# Patient Record
Sex: Female | Born: 1990 | Race: White | Hispanic: Yes | Marital: Single | Smoking: Never smoker
Health system: Southern US, Community
[De-identification: ages and names within clinical notes are randomized; demographics above are authoritative.]

## PROBLEM LIST (undated history)

## (undated) DIAGNOSIS — G35 Multiple sclerosis: Secondary | ICD-10-CM

## (undated) DIAGNOSIS — G43909 Migraine, unspecified, not intractable, without status migrainosus: Secondary | ICD-10-CM

## (undated) HISTORY — PX: LEEP: SHX91

## (undated) HISTORY — PX: TUBAL LIGATION: SHX77

---

## 2018-12-20 DIAGNOSIS — R87612 Low grade squamous intraepithelial lesion on cytologic smear of cervix (LGSIL): Secondary | ICD-10-CM | POA: Insufficient documentation

## 2020-12-10 ENCOUNTER — Encounter: Payer: Self-pay | Admitting: Neurology

## 2020-12-10 ENCOUNTER — Ambulatory Visit: Payer: Medicaid Other | Admitting: Neurology

## 2020-12-10 VITALS — BP 112/74 | HR 75 | Ht 65.0 in | Wt 149.0 lb

## 2020-12-10 DIAGNOSIS — R200 Anesthesia of skin: Secondary | ICD-10-CM | POA: Diagnosis not present

## 2020-12-10 DIAGNOSIS — R9082 White matter disease, unspecified: Secondary | ICD-10-CM | POA: Diagnosis not present

## 2020-12-10 DIAGNOSIS — M791 Myalgia, unspecified site: Secondary | ICD-10-CM | POA: Diagnosis not present

## 2020-12-10 NOTE — Progress Notes (Signed)
[facility]  PATIENT: Jessica Wiggins DOB: May 17, 1991  REFERRING DOCTOR OR PCP: [name] MD SOURCE: Patient, notes from [facility],  _________________________________   HISTORICAL  CHIEF COMPLAINT:  Chief Complaint  Patient presents with  . New Patient (Initial Visit)    RM 54 w/ interpreter, Jessica Wiggins. Paper referral from [facility], MD for new onset MS. Was seen at ED 11/16/20. Presented w/ numbness from neck, down that started 11/13/20. This was the first time she has had these sx. She received IV solumedrol x3 days. No family hx of MS. Still has hand cramps. Leg cramping resolved. When she stands for extended periods, gets tingling in legs. Did not have MRI CD but Dr. Epimenio Foot able to pull up images.    HISTORY OF PRESENT ILLNESS:  I had the pleasure of seeing patient, Jessica Wiggins, at the MS center [facility] for neurologic consultation regarding the possibility of MS.  She is a 30 year old woman who presented to the [facility] ED with numbness below her neck and cramping in her hands bilaterally.   This started about 3 weeks ago.   Initially, she also had some leg cramping.    She denied numbness,    She had an MRi of the brain showing some white matter foci, none that enhanced.   She had a telehealth consult with neurology.  Because the MRI showed some spots, she was admitted and received steroids.  She was admitted to the hospital and had 3 days of IV Solu-medrol.    She feels better since the steroids and now only ha cramping in her hands,      She has no fixed numbness.  However, when she stands up for a long time, she gets tingling in her legs.   She denies weakness.   She has no difficulty with her gait.   Bladder function is normal.   Vision is fine.     She denies any similar symptoms or episodes of numbness, weakness, clumsiness, gait change or vision change n the past.   She has no FH of  MS.     She is otherwise healthy and is not on any medications.     No h/o DM or HTN.   She does not smoke or drink ETOH.        Data: Imaging reviewed: CT scan of the head 11/17/2018 was normal.  MRI of the brain 11/16/2020 showed some scattered T2/FLAIR hyperintense foci in the hemispheres.  Mostly nonspecific.  A couple foci are periventricular.  None are juxtacortical.  No foci in the infratentorial white matter.  Normal enhancement pattern.  Labs: Blood work 11/16/2020 showed normal B12, TSH, unremarkable CBC and CMP.  Negative pregnancy test.  REVIEW OF SYSTEMS: Constitutional: No fevers, chills, sweats, or change in appetite Eyes: No visual changes, double vision, eye pain Ear, nose and throat: No hearing loss, ear pain, nasal congestion, sore throat Cardiovascular: No chest pain, palpitations Respiratory: No shortness of breath at rest or with exertion.   No wheezes GastrointestinaI: No nausea, vomiting, diarrhea, abdominal pain, fecal incontinence Genitourinary: No dysuria, urinary retention or frequency.  No nocturia. Musculoskeletal: No neck pain, back pain Integumentary: No rash, pruritus, skin lesions Neurological: as above Psychiatric: No depression at this time.  No anxiety Endocrine: No palpitations, diaphoresis, change in appetite, change in weigh or increased thirst Hematologic/Lymphatic: No anemia, purpura, petechiae. Allergic/Immunologic: No itchy/runny eyes, nasal congestion, recent allergic reactions, rashes  ALLERGIES: No  Known Allergies  HOME MEDICATIONS: No current outpatient medications on file.  PAST MEDICAL HISTORY: No past medical history on file.  PAST SURGICAL HISTORY: Past Surgical History:  Procedure Laterality Date  . LEEP    . TUBAL LIGATION      FAMILY HISTORY: History reviewed. No pertinent family history.  SOCIAL HISTORY:  Social History   Socioeconomic History  . Marital status: Single    Spouse name: Not on file  . Number  of children: 2  . Years of education: 34  . Highest education level: Not on file  Occupational History  . 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 Smoking status: Never Smoker  . Smokeless tobacco: Never Used  Substance and Sexual Activity  . Alcohol use: Never  . Drug use: Never  . Sexual activity: Not on file  Other Topics Concern  . Not on file  Social History Narrative   Right handed   Lives w/ husband and children   Caffeine use: none   Social Determinants of Health   Financial Resource Strain: Not on file  Food Insecurity: Not on file  Transportation Needs: Not on file  Physical Activity: Not on file  Stress: Not on file  Social Connections: Not on file  Intimate Partner Violence: Not on file     PHYSICAL EXAM  Vitals:   12/10/20 1319  BP: 112/74  Pulse: 75  Weight: 149 lb (67.6 kg)  Height: 5\' 5"  (1.651 m)    Body mass index is 24.79 kg/m.   General: The patient is well-developed and well-nourished and in no acute distress  HEENT:  Head is [place]/AT.  Sclera are anicteric.  Funduscopic exam shows normal optic discs and retinal vessels.  Neck: No carotid bruits are noted.  The neck is nontender.  Cardiovascular: The heart has a regular rate and rhythm with a normal S1 and S2. There were no murmurs, gallops or rubs.    Skin: Extremities are without rash or  edema.  Musculoskeletal:  Back is nontender  Neurologic Exam  Mental status: The patient is alert and oriented x 3 at the time of the examination. The patient has apparent normal recent and remote memory, with an apparently normal attention span and concentration ability.   Speech is normal.  Cranial nerves: Extraocular movements are full. Pupils are equal, round, and reactive to light and accomodation.  Visual fields are full.  Facial symmetry is present. There is good facial sensation to soft touch bilaterally.Facial strength is normal.  Trapezius and sternocleidomastoid strength is normal. No  dysarthria is noted.  The tongue is midline, and the patient has symmetric elevation of the soft palate. No obvious hearing deficits are noted.  Motor:  Muscle bulk is normal.   Tone is normal. Strength is  5 / 5 in all 4 extremities.   Sensory: Sensory testing is intact to pinprick, soft touch and vibration sensation in all 4 extremities.  Coordination: Cerebellar testing reveals good finger-nose-finger and heel-to-shin bilaterally.  Gait and station: Station is normal.   Gait is normal. Tandem gait is normal. Romberg is negative.   Reflexes: Deep tendon reflexes are symmetric and normal bilaterally.   Plantar responses are flexor.      ASSESSMENT AND PLAN  Numbness - Plan: MR CERVICAL SPINE W WO CONTRAST, Pan-ANCA, ANA w/Reflex, Sedimentation rate  Myalgia - Plan: MR CERVICAL SPINE W WO CONTRAST, Pan-ANCA, ANA w/Reflex, Sedimentation rate  White matter abnormality on MRI of brain - Plan: MR CERVICAL SPINE W WO  CONTRAST, Pan-ANCA, ANA w/Reflex, Sedimentation rate   In summary, Jessica Wiggins is a 30 year old woman who presented to the emergency room with nonfocal paresthesias and cramps and was found on MRI to have abnormal T2/flair hyperintense foci.  The pattern is quite nonspecific with only a couple small foci in the periventricular white matter and most foci in the deep white matter.  However, given her age MS is a possibility.  Her symptoms presented with bilateral numbness.  MRI of the cervical spine was not performed while she was in the ED or hospital though that would be the most likely location that could explain her symptoms.  We will check an MRI of the cervical spine.  If 1 or more foci are noted, then the possibility that she has MS is much greater and we would need to consider a disease modifying therapy.  However, if no foci are noted, MS becomes less likely.  We then would have a couple of options including checking CSF or doing a follow-up MRI in 6 to 9 months.  As the  foci on the MRI are quite nonspecifc, I think repeating an MRI in 6 to 9 months would be her best option.  I will have her come back to see me in 6 months if the MRI of the cervical spine is normal (and then we would set up a follow-up MRI of the brain) but will have her come in within a few weeks if the MRI of the cervical spine shows any foci concerning for MS.  Additionally I will check some blood work for vasculitis.  Thank you for asking me to see Ms. Jessica Wiggins.  Please let me know if I can be of further assistance with her or other patients in the future.   Lucila Klecka A. Epimenio Foot, MD, [facility] 12/10/2020, 1:54 PM Certified in Neurology, Clinical Neurophysiology, Sleep Medicine and Neuroimaging  [facility] [address] [place], [place] [address] [phone]

## 2020-12-12 LAB — PAN-ANCA
ANCA Proteinase 3: <3.5 U/mL (ref 0.0–3.5)
Atypical pANCA: <1:20 {titer}
C-ANCA: <1:20 {titer}
Myeloperoxidase Ab: <9 U/mL (ref 0.0–9.0)
P-ANCA: <1:20 {titer}

## 2020-12-12 LAB — ANA W/REFLEX: Anti Nuclear Antibody (ANA): NEGATIVE

## 2020-12-12 LAB — SEDIMENTATION RATE: Sed Rate: 2 mm/hr (ref 0–32)

## 2020-12-14 ENCOUNTER — Telehealth: Payer: Self-pay | Admitting: *Deleted

## 2020-12-14 ENCOUNTER — Telehealth: Payer: Self-pay | Admitting: Neurology

## 2020-12-14 NOTE — Telephone Encounter (Signed)
[facility] community plan Berkley Harvey #[id number] exp. 12/14/20 to 01/28/21 sent to GI for scheduling [facility] 12/14/20

## 2020-12-14 NOTE — Telephone Encounter (Signed)
-----   Message from Asa Lente, MD sent at 12/12/2020 11:52 AM EDT ----- Please let the patient know that the lab work is fine.

## 2020-12-14 NOTE — Telephone Encounter (Signed)
Called and spoke with pt about lab results per MD note. Pt verbalized understanding, had no further questions.

## 2020-12-17 ENCOUNTER — Other Ambulatory Visit: Payer: Self-pay

## 2020-12-17 ENCOUNTER — Ambulatory Visit
Admission: RE | Admit: 2020-12-17 | Discharge: 2020-12-17 | Disposition: A | Discharge status: Home / Self Care | Payer: Medicaid Other | Source: Ambulatory Visit | Attending: Neurology | Admitting: Neurology

## 2020-12-17 DIAGNOSIS — R9082 White matter disease, unspecified: Secondary | ICD-10-CM | POA: Diagnosis not present

## 2020-12-17 DIAGNOSIS — M791 Myalgia, unspecified site: Secondary | ICD-10-CM

## 2020-12-17 DIAGNOSIS — R200 Anesthesia of skin: Secondary | ICD-10-CM

## 2020-12-17 MED ORDER — GADOBENATE DIMEGLUMINE 529 MG/ML IV SOLN
13.0000 mL | Freq: Once | INTRAVENOUS | Status: AC | PRN
  Administered 2020-12-17: 13 mL via INTRAVENOUS

## 2020-12-23 ENCOUNTER — Telehealth: Payer: Self-pay | Admitting: *Deleted

## 2020-12-23 DIAGNOSIS — G35 Multiple sclerosis: Secondary | ICD-10-CM

## 2020-12-23 NOTE — Telephone Encounter (Signed)
Called interpreter services at [phone] and spoke w/ [place], [place] [id number]. She called and we spoke w/ pt. Relayed results per Dr. Bonnita Hollow note. Pt verbalized understanding. Scheduled follow up for 01/18/21 at 3:30pm w/ Dr. Epimenio Foot. Placed order for LP. She will call back if she does not hear in the next week or so to get scheduled.

## 2020-12-23 NOTE — Telephone Encounter (Signed)
-----   Message from Asa Lente, MD sent at 12/22/2020  2:22 PM EDT ----- MRI of the cervical spinal cord showed one spot so she might have MS --- I would like to have her get a lumbar puncture for typical MS studies (oligoclonal bands, IgG index, prot, gluc, cell count, VDRL) and then see me 2-3 weeks later

## 2020-12-23 NOTE — Telephone Encounter (Signed)
Faxed LP order to [facility] at [facility] @ [phone].

## 2020-12-30 ENCOUNTER — Other Ambulatory Visit: Payer: Self-pay

## 2020-12-30 ENCOUNTER — Ambulatory Visit
Admission: RE | Admit: 2020-12-30 | Discharge: 2020-12-30 | Disposition: A | Discharge status: Home / Self Care | Payer: Medicaid Other | Source: Ambulatory Visit | Attending: Neurology | Admitting: Neurology

## 2020-12-30 DIAGNOSIS — G35 Multiple sclerosis: Secondary | ICD-10-CM

## 2020-12-30 NOTE — Discharge Instructions (Signed)
Lumbar Puncture Discharge Instructions  1. Go home and rest quietly as needed. You may resume normal activities; however, do not exert yourself strongly or do any heavy lifting today and tomorrow.   2. DO NOT drive today.    3. You may resume your normal diet and medications unless otherwise indicated. Drink lots of extra fluids today and tomorrow.   4. The incidence of headache, nausea, or vomiting is about 5% (one in 20 patients).  If you develop a headache, lie flat for 24 hours and drink plenty of fluids until the headache goes away.  Caffeinated beverages may be helpful. If when you get up you still have a headache when standing, go back to bed and force fluids for another 24 hours.   5. If you develop severe nausea and vomiting or a headache that does not go away with the flat bedrest after 48 hours, please call [phone].   6. Call your physician for a follow-up appointment.  The results of your Lumbar Puncture will be sent directly to your physician and they will contact you.   7. If you have any questions or if complications develop after you arrive home, please call [phone].  Discharge instructions have been explained to the patient.  The patient, or the person responsible for the patient, fully understands these instructions.   Thank you for visiting our office today.  

## 2020-12-30 NOTE — Progress Notes (Signed)
Blood drawn from pts LAC to be sent off with LP lab work. 1 successful attempt, pt tolerated well. Gauze and tape applied after.

## 2021-01-03 LAB — VDRL, CSF: VDRL Quant, CSF: NONREACTIVE

## 2021-01-03 LAB — CNS IGG SYNTHESIS RATE, CSF+BLOOD
Albumin Serum: 3.9 g/dL (ref 3.5–5.2)
Albumin, CSF: 21.4 mg/dL (ref 8.0–42.0)
CNS-IgG Synthesis Rate: 7.2 mg/24 h — ABNORMAL HIGH (ref <=3.3)
IgG (Immunoglobin G), Serum: 987 mg/dL (ref 600–1640)
IgG Total CSF: 4.6 mg/dL (ref 0.8–7.7)
IgG-Index: 0.85 — ABNORMAL HIGH (ref <0.66)

## 2021-01-03 LAB — CSF CELL COUNT WITH DIFFERENTIAL
RBC Count, CSF: 0 cells/uL
WBC, CSF: 2 cells/uL (ref 0–5)

## 2021-01-03 LAB — GLUCOSE, CSF: Glucose, CSF: 49 mg/dL (ref 40–80)

## 2021-01-03 LAB — OLIGOCLONAL BANDS, CSF + SERM

## 2021-01-03 LAB — PROTEIN, CSF: Total Protein, CSF: 40 mg/dL (ref 15–45)

## 2021-01-05 ENCOUNTER — Telehealth: Payer: Self-pay | Admitting: Neurology

## 2021-01-05 NOTE — Telephone Encounter (Signed)
Spinal fluid results came back showing an elevated IgG index and the presence of oligoclonal bands.  Therefore, the combination of her symptoms, MRI and CSF allows [place] to diagnose her with clinically definite MS.  She has an appointment to come in 01/18/2021 and we will initiate a disease modifying therapy.  She speaks Spanish and we will get a Spanish interpreter for the visit

## 2021-01-18 ENCOUNTER — Encounter: Payer: Self-pay | Admitting: Neurology

## 2021-01-18 ENCOUNTER — Ambulatory Visit: Payer: Medicaid Other | Admitting: Neurology

## 2021-01-18 ENCOUNTER — Other Ambulatory Visit: Payer: Self-pay

## 2021-01-18 VITALS — BP 104/67 | HR 66 | Ht 65.0 in | Wt 149.5 lb

## 2021-01-18 DIAGNOSIS — Z79899 Other long term (current) drug therapy: Secondary | ICD-10-CM

## 2021-01-18 DIAGNOSIS — G35 Multiple sclerosis: Secondary | ICD-10-CM | POA: Insufficient documentation

## 2021-01-18 DIAGNOSIS — E559 Vitamin D deficiency, unspecified: Secondary | ICD-10-CM

## 2021-01-18 HISTORY — DX: Other long term (current) drug therapy: Z79.899

## 2021-01-18 HISTORY — DX: Vitamin D deficiency, unspecified: E55.9

## 2021-01-18 NOTE — Progress Notes (Signed)
[facility]  PATIENT: Jessica Wiggins DOB: 06-07-1991  REFERRING DOCTOR OR PCP: [name] MD SOURCE: Patient, notes from [facility],  _________________________________   HISTORICAL  CHIEF COMPLAINT:  Chief Complaint  Patient presents with   Follow-up    RM 12. Last seen 12/10/20. Newly dx MS patient. Here w/ spanish interpreter.    HISTORY OF PRESENT ILLNESS:  Jessica Wiggins, is a 30 y.o. woman with MS.  Update 01/18/2021: Since the last visit, she had an MRI of the cervical spine showing a non-enhancing focus at C3 potentially worrisome for MS.   CSF showed oligoclonal bands allowing the diagnosis of clinically definite MS.     We discussed aspects of MS and treatment.  We went over some options.   MS History: She presented to [facility] ED in April 2022 with numbness below her neck and cramping in her hands bilaterally.       Initially, she also had some leg cramping.    She denied numbness,    She had an MRi of the brain showing some white matter foci, none that enhanced.   She had a telehealth consult with neurology.  Because the MRI showed some spots, she was admitted and received steroids.  She was admitted to the hospital and had 3 days of IV Solu-medrol.    She feels better since the steroids and now only ha cramping in her hands,      She has no fixed numbness.  However, when she stands up for a long time, she gets tingling in her legs.   She denies weakness.   She has no difficulty with her gait.   Bladder function is normal.   Vision is fine.     She denies any similar symptoms or episodes of numbness, weakness, clumsiness, gait change or vision change n the past.   She has no FH of MS.     She is otherwise healthy and is not on any medications.     No h/o DM or HTN.   She does not smoke or drink ETOH.        Data: Imaging reviewed: CT scan of the head 11/17/2018 was normal.  MRI of the brain 11/16/2020 showed some  scattered T2/FLAIR hyperintense foci in the hemispheres.  Mostly nonspecific.  A couple foci are periventricular.  None are juxtacortical.  No foci in the infratentorial white matter.  Normal enhancement pattern.  MRI cervical spine 12/17/2020 showed a T2 hyperintense focus within the central and posterior spinal cord adjacent to C3.  It does not enhance but slightly expands the cervical spinal cord.  The focus could be consistent with demyelination from transverse myelitis or multiple sclerosis  Labs: Blood work 11/16/2020 showed normal B12, TSH, unremarkable CBC and CMP.  Negative pregnancy test.  CSF 12/30/2020 showed > 5 oligoclonal bands and IgG index was elevate (0.85)   REVIEW OF SYSTEMS: Constitutional: No fevers, chills, sweats, or change in appetite Eyes: No visual changes, double vision, eye pain Ear, nose and throat: No hearing loss, ear pain, nasal congestion, sore throat Cardiovascular: No chest pain, palpitations Respiratory:  No shortness of breath at rest or with exertion.   No wheezes GastrointestinaI: No nausea, vomiting, diarrhea, abdominal pain, fecal incontinence Genitourinary:  No dysuria, urinary retention or frequency.  No nocturia. Musculoskeletal:  No neck pain, back pain Integumentary: No rash, pruritus, skin lesions Neurological: as above Psychiatric: No depression at this time.  No anxiety Endocrine: No palpitations, diaphoresis,  change in appetite, change in weigh or increased thirst Hematologic/Lymphatic:  No anemia, purpura, petechiae. Allergic/Immunologic: No itchy/runny eyes, nasal congestion, recent allergic reactions, rashes  ALLERGIES: No Known Allergies  HOME MEDICATIONS: No current outpatient medications on file.  PAST MEDICAL HISTORY: No past medical history on file.  PAST SURGICAL HISTORY: Past Surgical History:  Procedure Laterality Date   LEEP     TUBAL LIGATION      FAMILY HISTORY: No family history on file.  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  Tobacco Use   Smoking status: Never   Smokeless tobacco: Never  Substance and Sexual Activity   Alcohol use: Never   Drug use: Never   Sexual activity: Not on file  Other Topics Concern   Not on file  Social History Narrative   Right handed   Lives w/ husband and children   Caffeine use: none   Social Determinants of Health   Financial Resource Strain: Not on file  Food Insecurity: Not on file  Transportation Needs: Not on file  Physical Activity: Not on file  Stress: Not on file  Social Connections: Not on file  Intimate Partner Violence: Not on file     PHYSICAL EXAM  Vitals:   01/18/21 1501  BP: 104/67  Pulse: 66  Weight: 149 lb 8 oz (67.8 kg)  Height: 5\' 5"  (1.651 m)    Body mass index is 24.88 kg/m.   General: The patient is well-developed and well-nourished and in no acute distress  HEENT:  Head is [place]/AT.  Sclera are anicteric.    Neck:  The neck is nontender.  Skin: Extremities are without rash or  edema.  Neurologic Exam  Mental status: The patient is alert and oriented x 3 at the time of the examination. The patient has apparent normal recent and remote memory, with an apparently normal attention span and concentration ability.   Speech is normal.  Cranial nerves: Extraocular movements are full.  Color vision was symmetric.  Facial symmetry is present. There is good facial sensation to soft touch bilaterally.Facial strength is normal.  Trapezius and sternocleidomastoid strength is normal. No dysarthria is noted.  The tongue is midline, and the patient has symmetric elevation of the soft palate. No obvious hearing deficits are noted.  Motor:  Muscle bulk is normal.   Tone is normal. Strength is  5 / 5 in  all 4 extremities.   Sensory: Now has normal sensation in the arms and legs..  Coordination: Cerebellar testing reveals good finger-nose-finger and heel-to-shin bilaterally.  Gait and station: Station is normal.   Gait is normal. Tandem gait is minimally wide.. Romberg is negative.   Reflexes: Deep tendon reflexes are symmetric and normal bilaterally.   Plantar responses are flexor.      ASSESSMENT AND PLAN  Multiple sclerosis (HCC) - Plan: CBC with Differential/Platelet, Stratify JCV Antibody Test ([facility]), Varicella zoster antibody, IgG, Hepatic function panel  High risk medication use - Plan: CBC with Differential/Platelet, Stratify JCV Antibody Test ([facility]), Varicella zoster antibody, IgG, Hepatic function panel  Vitamin D deficiency - Plan: VITAMIN D 25 Hydroxy (Vit-D Deficiency, Fractures)  She has clinically definite multiple sclerosis.  The course is consistent with relapsing remitting MS.  We discussed options and  she will start Tecfidera.  We discussed the possible adverse events.  She will let us know if she has any major problems with tolerability. Stay active and exercise as tolerated. Check vitamin D.  We discussed the relationship between low vitamin D and risk of MS issues. Return in 4 months or sooner if there are new or worsening neurologic symptoms.  Chauncy Mangiaracina A. Epimenio Foot, MD, [facility] 01/18/2021, 6:42 PM Certified in Neurology, Clinical Neurophysiology, Sleep Medicine and Neuroimaging  [facility] [address], [address] [place], [place] [address] [phone]

## 2021-01-19 ENCOUNTER — Telehealth: Payer: Self-pay

## 2021-01-19 LAB — HEPATIC FUNCTION PANEL
ALT: 9 IU/L (ref 0–32)
AST: 14 IU/L (ref 0–40)
Albumin: 4.5 g/dL (ref 3.9–5.0)
Alkaline Phosphatase: 66 IU/L (ref 44–121)
Bilirubin Total: 0.5 mg/dL (ref 0.0–1.2)
Bilirubin, Direct: 0.13 mg/dL (ref 0.00–0.40)
Total Protein: 6.6 g/dL (ref 6.0–8.5)

## 2021-01-19 LAB — CBC WITH DIFFERENTIAL/PLATELET
Basophils Absolute: 0 10*3/uL (ref 0.0–0.2)
Basos: 1 %
EOS (ABSOLUTE): 0.2 10*3/uL (ref 0.0–0.4)
Eos: 3 %
Hematocrit: 42.1 % (ref 34.0–46.6)
Hemoglobin: 14.2 g/dL (ref 11.1–15.9)
Immature Grans (Abs): 0 10*3/uL (ref 0.0–0.1)
Immature Granulocytes: 0 %
Lymphocytes Absolute: 1.5 10*3/uL (ref 0.7–3.1)
Lymphs: 21 %
MCH: 29.8 pg (ref 26.6–33.0)
MCHC: 33.7 g/dL (ref 31.5–35.7)
MCV: 88 fL (ref 79–97)
Monocytes Absolute: 0.6 10*3/uL (ref 0.1–0.9)
Monocytes: 8 %
Neutrophils Absolute: 4.9 10*3/uL (ref 1.4–7.0)
Neutrophils: 67 %
Platelets: 260 10*3/uL (ref 150–450)
RBC: 4.77 x10E6/uL (ref 3.77–5.28)
RDW: 12.5 % (ref 11.7–15.4)
WBC: 7.2 10*3/uL (ref 3.4–10.8)

## 2021-01-19 LAB — VARICELLA ZOSTER ANTIBODY, IGG: Varicella zoster IgG: <135 index — ABNORMAL LOW (ref >165)

## 2021-01-19 LAB — VITAMIN D 25 HYDROXY (VIT D DEFICIENCY, FRACTURES): Vit D, 25-Hydroxy: 21.3 ng/mL — ABNORMAL LOW (ref 30.0–100.0)

## 2021-01-19 MED ORDER — VITAMIN D (ERGOCALCIFEROL) 1.25 MG (50000 UNIT) PO CAPS: 50000.0000 [IU] | ORAL_CAPSULE | ORAL | 1 refills | Status: DC

## 2021-01-19 NOTE — Telephone Encounter (Signed)
I spoke with Dr. Epimenio Foot regarding patient's lab work.  Patient is not immune to varicella.  She is not eligible yet for shingles vaccine.  She could get the chickenpox vaccine but she would need to be off Tecfidera or medications for at least a month.  Dr. Epimenio Foot does not feel this is worth it at this point.  Patient also does not have a primary care to coordinate this immunization.  Patient's vitamin D level was low and Dr. Epimenio Foot recommended supplementing her with vitamin D with 50,000 units once weekly for 6 months.  Otherwise, her labs look fine.  The Tecfidera start form can be sent in.  I called patient using a Spanish interpreter to discuss this.  Patient is agreeable to this plan.  She asked that the vitamin D be called into [facility] in [place].  I advised her that I will start working with her insurance to obtain authorization for Tecfidera and she will be getting calls from [employer].  I will check on her in a week or 2 to see if she has received her Tecfidera.  Patient verbalized understanding.  PA initiated on CMM. Key: [id number].  Sent to [facility] [employer]. Should have a determination in 1-3 business days.

## 2021-01-20 NOTE — Telephone Encounter (Signed)
Received fax from [facility] that no PA needed for Tecfidera, must fill 30 days supply at a time. Faxed notice to [employer] at [phone]. Received fax confirmation.

## 2021-02-15 NOTE — Telephone Encounter (Signed)
I called patient using Spanish interpreter [place], [place] [id number].  Patient reports that she received Tecfidera and is taking it.  She is tolerating it fairly well.  She has mild headaches that she is able to control with OTC meds.  She would like to continue with the Tecfidera.  I reminded her of her appointment with Dr. Epimenio Foot on September 29.  Patient verbalized understanding.  She will let us know if she has further questions or concerns.

## 2021-05-06 ENCOUNTER — Other Ambulatory Visit: Payer: Self-pay

## 2021-05-06 ENCOUNTER — Encounter: Payer: Self-pay | Admitting: Neurology

## 2021-05-06 ENCOUNTER — Ambulatory Visit: Payer: Medicaid Other | Admitting: Neurology

## 2021-05-06 VITALS — BP 114/62 | HR 75 | Ht 65.0 in | Wt 163.0 lb

## 2021-05-06 DIAGNOSIS — R200 Anesthesia of skin: Secondary | ICD-10-CM

## 2021-05-06 DIAGNOSIS — E559 Vitamin D deficiency, unspecified: Secondary | ICD-10-CM

## 2021-05-06 DIAGNOSIS — G35 Multiple sclerosis: Secondary | ICD-10-CM | POA: Diagnosis not present

## 2021-05-06 DIAGNOSIS — M791 Myalgia, unspecified site: Secondary | ICD-10-CM

## 2021-05-06 DIAGNOSIS — Z79899 Other long term (current) drug therapy: Secondary | ICD-10-CM | POA: Diagnosis not present

## 2021-05-06 HISTORY — DX: Anesthesia of skin: R20.0

## 2021-05-06 MED ORDER — GABAPENTIN 300 MG PO CAPS: 300.0000 mg | ORAL_CAPSULE | Freq: Three times a day (TID) | ORAL | 5 refills | Status: DC

## 2021-05-06 NOTE — Progress Notes (Signed)
[facility]  PATIENT: Jessica Wiggins DOB: 1991-05-18  REFERRING DOCTOR OR PCP: [name] MD SOURCE: Patient, notes from [facility] health,  _________________________________   HISTORICAL  CHIEF COMPLAINT:  Chief Complaint  Patient presents with   Follow-up    Rm 1, w mother as interpreter. Here for 3 month MS f/u, pt was started on Tefidera, had HA for the first few days and has been fine since. Pt continues to have bilateral hand cramping.     HISTORY OF PRESENT ILLNESS:  Jessica Wiggins, is a 30 y.o. woman with MS.  Update 05/06/2021: At the last visit, Tecfidera was started,.   She is tolerating it well.    Her MS has been mostly stable.    She has cramp-like pain in her  elbows to her hands but no new numbness or weakness.    This started in April.  She does not report any phasic spasms or spasticity in the arms.   MRI of the cervical spine showing a non-enhancing focus at C3 potentially worrisome for MS.   CSF showed oligoclonal bands allowing the diagnosis of clinically definite MS.     Gait is off balance though she is able to keep up with everybody he can easily walk a mile.  She can go up and down stairs without using the banister but will often use it for safety..  She gets dysesthetic pain in her hands.  The numbness resolved elsewhere.  Vision is fine.  Bladder function is fine.  She denies depression.  Cognitive function is fine.  MS History: She presented to the [facility] ED in April 2022 with numbness below her neck and cramping in her hands bilaterally.       Initially, she also had some leg cramping.    She denied numbness,    She had an MRi of the brain showing some white matter foci, none that enhanced.   She had a telehealth consult with neurology.  Because the MRI showed some spots, she was admitted and received steroids.  She was admitted to the hospital and had 3 days of IV Solu-medrol.    She feels better since the steroids and  now only ha cramping in her hands,      She has no fixed numbness.  However, when she stands up for a long time, she gets tingling in her legs.   She denies weakness.   She has no difficulty with her gait.   Bladder function is normal.   Vision is fine.     She denies any similar symptoms or episodes of numbness, weakness, clumsiness, gait change or vision change n the past.   She has no FH of MS.     She is otherwise healthy and is not on any medications.     No h/o DM or HTN.   She does not smoke or drink ETOH.        Data: Imaging reviewed: CT scan of the head 11/17/2018 was normal.  MRI of the brain 11/16/2020 showed some scattered T2/FLAIR hyperintense foci in the hemispheres.  Mostly nonspecific.  A couple foci are periventricular.  None are juxtacortical.  No foci in the infratentorial white matter.  Normal enhancement pattern.  MRI cervical spine 12/17/2020 showed a T2 hyperintense focus within the central and posterior spinal cord adjacent to C3.  It does not enhance but slightly expands the cervical spinal cord.  The focus could be consistent with demyelination from transverse myelitis or  multiple sclerosis  Labs: Blood work 11/16/2020 showed normal B12, TSH, unremarkable CBC and CMP.  Negative pregnancy test.  CSF 12/30/2020 showed > 5 oligoclonal bands and IgG index was elevate (0.85)   REVIEW OF SYSTEMS: Constitutional: No fevers, chills, sweats, or change in appetite Eyes: No visual changes, double vision, eye pain Ear, nose and throat: No hearing loss, ear pain, nasal congestion, sore throat Cardiovascular: No chest pain, palpitations Respiratory:  No shortness of breath at rest or with exertion.   No wheezes GastrointestinaI: No nausea, vomiting, diarrhea, abdominal pain, fecal incontinence Genitourinary:  No dysuria, urinary retention or frequency.  No nocturia. Musculoskeletal:  No neck pain, back pain Integumentary: No rash, pruritus, skin lesions Neurological: as  above Psychiatric: No depression at this time.  No anxiety Endocrine: No palpitations, diaphoresis, change in appetite, change in weigh or increased thirst Hematologic/Lymphatic:  No anemia, purpura, petechiae. Allergic/Immunologic: No itchy/runny eyes, nasal congestion, recent allergic reactions, rashes  ALLERGIES: No Known Allergies  HOME MEDICATIONS:  Current Outpatient Medications:    Dimethyl Fumarate 240 MG CPDR, Take 240 mg by mouth 2 (two) times daily. Brand Name, Disp: , Rfl:    gabapentin (NEURONTIN) 300 MG capsule, Take 1 capsule (300 mg total) by mouth 3 (three) times daily., Disp: 90 capsule, Rfl: 5   Vitamin D, Ergocalciferol, (DRISDOL) 1.25 MG (50000 UNIT) CAPS capsule, Take 1 capsule (50,000 Units total) by mouth every 7 (seven) days., Disp: 13 capsule, Rfl: 1  PAST MEDICAL HISTORY: History reviewed. No pertinent past medical history.  PAST SURGICAL HISTORY: Past Surgical History:  Procedure Laterality Date   LEEP     TUBAL LIGATION      FAMILY HISTORY: History reviewed. No pertinent family history.  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  Tobacco Use   Smoking status: Never   Smokeless tobacco: Never  Substance and Sexual Activity   Alcohol use: Never   Drug use: Never   Sexual activity: Not on file  Other Topics Concern   Not on file  Social History Narrative   Right handed   Lives w/ husband and children   Caffeine use: none   Social Determinants of Health   Financial Resource Strain: Not on file  Food Insecurity: Not on file  Transportation Needs: Not on file  Physical Activity: Not on file  Stress: Not on file  Social Connections: Not on file  Intimate Partner Violence: Not on file     PHYSICAL EXAM  Vitals:   05/06/21  1435  BP: 114/62  Pulse: 75  Weight: 163 lb (73.9 kg)  Height: 5\' 5"  (1.651 m)    Body mass index is 27.12 kg/m.   General: The patient is well-developed and well-nourished and in no acute distress  HEENT:  Head is [place]/AT.  Sclera are anicteric.    Neck:  The neck is nontender.  Skin: Extremities are without rash or  edema.  Neurologic Exam  Mental status: The patient is alert and oriented x 3 at the time of the examination. The patient has apparent normal recent and remote memory, with an apparently normal attention span and concentration ability.   Speech is normal.  Cranial nerves: Extraocular movements are full.  Color vision was symmetric.  Facial symmetry is  present. There is good facial sensation to soft touch bilaterally.Facial strength is normal.   No obvious hearing deficits are noted.  Motor:  Muscle bulk is normal.   Tone is normal. Strength is  5 / 5 in all 4 extremities.   Sensory: Now has normal sensation in the arms and legs..  Coordination: Cerebellar testing reveals good finger-nose-finger and heel-to-shin bilaterally.  Gait and station: Station is normal.   Gait is normal. Tandem gait is wide..  Her current was mildly unstable Romberg is negative.   Reflexes: Deep tendon reflexes are symmetric and normal bilaterally.   r.      ASSESSMENT AND PLAN  Multiple sclerosis (HCC) - Plan: CBC with Differential/Platelet, Hepatic function panel  High risk medication use - Plan: CBC with Differential/Platelet, Hepatic function panel  Numbness  Myalgia  Vitamin D deficiency  She will continue Tecfidera.  Check CBC with differential and liver function test today.  Sometime next year we will check an MRI of the brain and compared with her previous one to determine if there is any subclinical progression.  If this is occurring we would need to change to a more efficacious disease modifying therapy.   Stay active and exercise as tolerated. Check vitamin D.  We  discussed the relationship between low vitamin D and risk of MS issues. Return in 6 months or sooner if there are new or worsening neurologic symptoms.  Myonna Chisom A. Epimenio Foot, MD, [facility] 05/06/2021, 5:14 PM Certified in Neurology, Clinical Neurophysiology, Sleep Medicine and Neuroimaging  [facility] [address], [address] [place], [place] [address] [phone]

## 2021-05-07 LAB — CBC WITH DIFFERENTIAL/PLATELET
Basophils Absolute: 0 10*3/uL (ref 0.0–0.2)
Basos: 1 %
EOS (ABSOLUTE): 0.3 10*3/uL (ref 0.0–0.4)
Eos: 4 %
Hematocrit: 36.5 % (ref 34.0–46.6)
Hemoglobin: 12.4 g/dL (ref 11.1–15.9)
Immature Grans (Abs): 0 10*3/uL (ref 0.0–0.1)
Immature Granulocytes: 0 %
Lymphocytes Absolute: 1 10*3/uL (ref 0.7–3.1)
Lymphs: 12 %
MCH: 29.6 pg (ref 26.6–33.0)
MCHC: 34 g/dL (ref 31.5–35.7)
MCV: 87 fL (ref 79–97)
Monocytes Absolute: 0.8 10*3/uL (ref 0.1–0.9)
Monocytes: 10 %
Neutrophils Absolute: 5.9 10*3/uL (ref 1.4–7.0)
Neutrophils: 73 %
Platelets: 304 10*3/uL (ref 150–450)
RBC: 4.19 x10E6/uL (ref 3.77–5.28)
RDW: 12 % (ref 11.7–15.4)
WBC: 8.1 10*3/uL (ref 3.4–10.8)

## 2021-05-07 LAB — HEPATIC FUNCTION PANEL
ALT: 14 IU/L (ref 0–32)
AST: 25 IU/L (ref 0–40)
Albumin: 4.6 g/dL (ref 3.9–5.0)
Alkaline Phosphatase: 60 IU/L (ref 44–121)
Bilirubin Total: 0.4 mg/dL (ref 0.0–1.2)
Bilirubin, Direct: 0.14 mg/dL (ref 0.00–0.40)
Total Protein: 6.8 g/dL (ref 6.0–8.5)

## 2021-05-10 ENCOUNTER — Telehealth: Payer: Self-pay | Admitting: *Deleted

## 2021-05-10 NOTE — Telephone Encounter (Signed)
Called interpreter service line and spoke w/ spanish interpreter, Yaakov Guthrie (ID [phone]). He tried calling pt, went to VM, VM not set up. He called husband on DPR and relayed results per Dr. Epimenio Foot note. Husband verbalized understanding.

## 2021-05-10 NOTE — Telephone Encounter (Signed)
-----   Message from Asa Lente, MD sent at 05/07/2021 10:16 AM EDT ----- Please let her or family know that her lab work was normal.

## 2021-06-17 ENCOUNTER — Other Ambulatory Visit: Payer: Self-pay

## 2021-06-17 ENCOUNTER — Encounter: Payer: Self-pay | Admitting: Neurology

## 2021-06-17 ENCOUNTER — Ambulatory Visit: Payer: Medicaid Other | Admitting: Neurology

## 2021-06-17 VITALS — BP 117/68 | HR 69 | Ht 65.0 in | Wt 166.0 lb

## 2021-06-17 DIAGNOSIS — G35 Multiple sclerosis: Secondary | ICD-10-CM

## 2021-06-17 DIAGNOSIS — R200 Anesthesia of skin: Secondary | ICD-10-CM

## 2021-06-17 DIAGNOSIS — Z79899 Other long term (current) drug therapy: Secondary | ICD-10-CM | POA: Diagnosis not present

## 2021-06-17 DIAGNOSIS — E559 Vitamin D deficiency, unspecified: Secondary | ICD-10-CM | POA: Diagnosis not present

## 2021-06-17 NOTE — Progress Notes (Signed)
[facility]  PATIENT: Jessica Wiggins DOB: [birth year]  REFERRING DOCTOR OR PCP: [name] MD SOURCE: Patient, notes from [facility] health,  _________________________________   HISTORICAL  CHIEF COMPLAINT:  Chief Complaint  Patient presents with   Follow-up    Rm 2, w interpreter Marcial Pacas. Here for 6 month MS f/u, on dimethyl Fumerate and tolerating well. Pt reports not taking gabapentin due to GI pn. Pt reports chest tightness and burning Friday. Pt went to the ED on Saturday early morning and was not seen. Pt then returned Monday 06/14/21 had work up done and everything came back normal. Pt continues to have HA. Taking Excedrin.     HISTORY OF PRESENT ILLNESS:  Jessica Wiggins, is a 30 y.o. woman with MS.  Update 06/17/2021: The visit was conducted with an interpreter.  At the last visit, Tecfidera was started,.   She is tolerating it well.  She denies stomach upset.      Her MS has been mostly stable.     Gait is off balance though she is able to keep up with everybody he can easily walk a mile.  She can go up and down stairs without using the banister but will often use it for safety..  She gets dysesthetic pain in both  hands.  The numbness resolved elsewhere.  Vision is fine.  Bladder function is fine.  She denies depression.  Cognitive function is fine.  MS History: She presented to the [facility] ED in April 2022 with numbness below her neck and cramping in her hands bilaterally.       Initially, she also had some leg cramping.    She denied numbness,    She had an MRi of the brain showing some white matter foci, none that enhanced.   She had a telehealth consult with neurology.  Because the MRI showed some spots, she was admitted and received steroids.  She was admitted to the hospital and had 3 days of IV Solu-medrol.    She feels better since the steroids and now only ha cramping in her hands,      She has no fixed numbness.  However, when  she stands up for a long time, she gets tingling in her legs.   She denies weakness.   She has no difficulty with her gait.   Bladder function is normal.   Vision is fine.     She denies any similar symptoms or episodes of numbness, weakness, clumsiness, gait change or vision change n the past.   She has no FH of MS.     She is otherwise healthy and is not on any medications.     No h/o DM or HTN.   She does not smoke or drink ETOH.        Data: Imaging reviewed: CT scan of the head 11/17/2018 was normal.  MRI of the brain 11/16/2020 showed some scattered T2/FLAIR hyperintense foci in the hemispheres.  Mostly nonspecific.  A couple foci are periventricular.  None are juxtacortical.  No foci in the infratentorial white matter.  Normal enhancement pattern.  MRI cervical spine 12/17/2020 showed a T2 hyperintense focus within the central and posterior spinal cord adjacent to C3.  It does not enhance but slightly expands the cervical spinal cord.  The focus could be consistent with demyelination from transverse myelitis or multiple sclerosis  Labs: Blood work 11/16/2020 showed normal B12, TSH, unremarkable CBC and CMP.  Negative pregnancy test.  CSF 12/30/2020 showed >  5 oligoclonal bands and IgG index was elevate (0.85)   REVIEW OF SYSTEMS: Constitutional: No fevers, chills, sweats, or change in appetite Eyes: No visual changes, double vision, eye pain Ear, nose and throat: No hearing loss, ear pain, nasal congestion, sore throat Cardiovascular: No chest pain, palpitations Respiratory:  No shortness of breath at rest or with exertion.   No wheezes GastrointestinaI: No nausea, vomiting, diarrhea, abdominal pain, fecal incontinence Genitourinary:  No dysuria, urinary retention or frequency.  No nocturia. Musculoskeletal:  No neck pain, back pain Integumentary: No rash, pruritus, skin lesions Neurological: as above Psychiatric: No depression at this time.  No anxiety Endocrine: No palpitations,  diaphoresis, change in appetite, change in weigh or increased thirst Hematologic/Lymphatic:  No anemia, purpura, petechiae. Allergic/Immunologic: No itchy/runny eyes, nasal congestion, recent allergic reactions, rashes  ALLERGIES: No Known Allergies  HOME MEDICATIONS:  Current Outpatient Medications:    Dimethyl Fumarate 240 MG CPDR, Take 240 mg by mouth 2 (two) times daily. Brand Name, Disp: , Rfl:    gabapentin (NEURONTIN) 300 MG capsule, Take 1 capsule (300 mg total) by mouth 3 (three) times daily., Disp: 90 capsule, Rfl: 5  PAST MEDICAL HISTORY: History reviewed. No pertinent past medical history.  PAST SURGICAL HISTORY: Past Surgical History:  Procedure Laterality Date   LEEP     TUBAL LIGATION      FAMILY HISTORY: History reviewed. No pertinent family history.  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  Tobacco Use   Smoking status: Never   Smokeless tobacco: Never  Substance and Sexual Activity   Alcohol use: Never   Drug use: Never   Sexual activity: Not on file  Other Topics Concern   Not on file  Social History Narrative   Right handed   Lives w/ husband and children   Caffeine use: none   Social Determinants of Health   Financial Resource Strain: Not on file  Food Insecurity: Not on file  Transportation Needs: Not on file  Physical Activity: Not on file  Stress: Not on file  Social Connections: Not on file  Intimate Partner Violence: Not on file     PHYSICAL EXAM  Vitals:   06/17/21 1441  BP: 117/68  Pulse: 69  Weight: 166 lb (75.3 kg)  Height: 5\' 5"  (1.651 m)    Body mass index is 27.62 kg/m.   General: The patient is well-developed and well-nourished and in no acute distress  HEENT:  Head is [place]/AT.  Sclera are anicteric.     Skin: Extremities are without rash or  edema.  Neurologic Exam  Mental status: The patient is alert and oriented x 3 at the time of the examination. The patient has apparent normal recent and remote memory, with an apparently normal attention span and concentration ability.   Speech is normal.  Cranial nerves: Extraocular movements are full.   Facial symmetry is present. There is good facial sensation to soft touch bilaterally.Facial strength is normal.   No obvious hearing deficits are noted.  Motor:  Muscle bulk is normal.   Tone is normal. Strength is  5 / 5 in all 4 extremities.   Sensory: Now has normal sensation in the arms and legs..  Coordination: Cerebellar testing reveals good finger-nose-finger and heel-to-shin  bilaterally.  Gait and station: Station is normal.   Gait is normal. Tandem gait is wide..  The Romberg was negative.  Reflexes: Deep tendon reflexes are symmetric and normal bilaterally.        ASSESSMENT AND PLAN  Multiple sclerosis (HCC) - Plan: CBC with Differential/Platelet, Comprehensive metabolic panel  High risk medication use - Plan: CBC with Differential/Platelet, Comprehensive metabolic panel  Numbness  Vitamin D deficiency  She will continue Tecfidera.  Check CBC with differential and liver function test today.  Sometime next year we will check an MRI of the brain and compared with her previous one to determine if there is any subclinical progression.  If this is occurring we would need to change to a more efficacious disease modifying therapy.   Stay active and exercise as tolerated. Check vitamin D.  We discussed the relationship between low vitamin D and risk of MS issues. Return in 6 months or sooner if there are new or worsening neurologic symptoms.  Hershey Knauer A. Epimenio Foot, MD, [facility] 06/17/2021, 3:00 PM Certified in Neurology, Clinical Neurophysiology, Sleep Medicine and Neuroimaging  [facility] [address], [address] [place], [place] [address] [phone]

## 2021-06-17 NOTE — Patient Instructions (Signed)
Take Vitamin D   2000 Units daily

## 2021-06-18 LAB — COMPREHENSIVE METABOLIC PANEL
ALT: 12 IU/L (ref 0–32)
AST: 13 IU/L (ref 0–40)
Albumin/Globulin Ratio: 2.2 (ref 1.2–2.2)
Albumin: 4.2 g/dL (ref 3.9–5.0)
Alkaline Phosphatase: 61 IU/L (ref 44–121)
BUN/Creatinine Ratio: 16 (ref 9–23)
BUN: 11 mg/dL (ref 6–20)
Bilirubin Total: 0.2 mg/dL (ref 0.0–1.2)
CO2: 26 mmol/L (ref 20–29)
Calcium: 9.2 mg/dL (ref 8.7–10.2)
Chloride: 102 mmol/L (ref 96–106)
Creatinine, Ser: 0.68 mg/dL (ref 0.57–1.00)
Globulin, Total: 1.9 g/dL (ref 1.5–4.5)
Glucose: 93 mg/dL (ref 70–99)
Potassium: 3.6 mmol/L (ref 3.5–5.2)
Sodium: 140 mmol/L (ref 134–144)
Total Protein: 6.1 g/dL (ref 6.0–8.5)
eGFR: 121 mL/min/{1.73_m2} (ref >59)

## 2021-06-18 LAB — CBC WITH DIFFERENTIAL/PLATELET
Basophils Absolute: 0 10*3/uL (ref 0.0–0.2)
Basos: 0 %
EOS (ABSOLUTE): 0.2 10*3/uL (ref 0.0–0.4)
Eos: 3 %
Hematocrit: 35.4 % (ref 34.0–46.6)
Hemoglobin: 12.2 g/dL (ref 11.1–15.9)
Immature Grans (Abs): 0 10*3/uL (ref 0.0–0.1)
Immature Granulocytes: 1 %
Lymphocytes Absolute: 0.9 10*3/uL (ref 0.7–3.1)
Lymphs: 11 %
MCH: 29.7 pg (ref 26.6–33.0)
MCHC: 34.5 g/dL (ref 31.5–35.7)
MCV: 86 fL (ref 79–97)
Monocytes Absolute: 0.6 10*3/uL (ref 0.1–0.9)
Monocytes: 7 %
Neutrophils Absolute: 6.6 10*3/uL (ref 1.4–7.0)
Neutrophils: 78 %
Platelets: 236 10*3/uL (ref 150–450)
RBC: 4.11 x10E6/uL (ref 3.77–5.28)
RDW: 12.1 % (ref 11.7–15.4)
WBC: 8.3 10*3/uL (ref 3.4–10.8)

## 2021-06-21 ENCOUNTER — Telehealth: Payer: Self-pay | Admitting: Neurology

## 2021-06-21 ENCOUNTER — Telehealth: Payer: Self-pay

## 2021-06-21 NOTE — Telephone Encounter (Signed)
Attempted to call the pt to review the lab results. There was no answer and mailbox full. We will have to try again.

## 2021-06-21 NOTE — Telephone Encounter (Signed)
-----   Message from Asa Lente, MD sent at 06/18/2021  9:45 AM EST ----- Please let the patient know that the lab work is fine.

## 2021-06-21 NOTE — Telephone Encounter (Signed)
Attempted to call pt and husband on DPR and no answer. Pts VM is full and husband has not VM set up. Will try again later.

## 2021-06-22 ENCOUNTER — Encounter: Payer: Self-pay | Admitting: *Deleted

## 2021-06-22 NOTE — Telephone Encounter (Signed)
Called pt. VM full. Unable to leave message. This is third attempt to call. Will send letter re results.

## 2021-09-02 ENCOUNTER — Telehealth: Payer: Self-pay | Admitting: Neurology

## 2021-09-02 NOTE — Telephone Encounter (Signed)
Jessica Wiggins(mother NOT on DPR) has called to report that pt went to work but she is having pain on head, headaches, pain in hands and back pt is feeling & vomitting. Pt's mother has suggested RN has the interpreter to call pt anytime after 3:30

## 2021-09-02 NOTE — Telephone Encounter (Signed)
Called the patient back using our spanish interpretor. There was no answer an VM was full. Will attempt later  If pt returns call, I will try and take the call if available but the symptoms described by the mother would lead me to believe that this most likely is not related to her MS. Pt should contact PCP to ensure there is no viral infection or something going on.

## 2021-09-07 NOTE — Telephone Encounter (Signed)
Pt mother called with patient today to discuss symptoms not improving. Pt's mother is not on DPR form. Received verbal permission to discuss pt care with her mom. She states that she continues to not feel well at all. She complains of generalized weakness. She almost fell at work today. She has had headaches. She went to her pcp who completed work up and there was nothing that seemed to be a problem. She is concerned because it is not inmproving and she asked to be seen sooner by Dr Felecia Shelling. I was able to to work the pt in for a follow up visit on Thursday 09/09/21 at 77 am. Mother will come to the visit. Advised should complete a DPR form while she is here.

## 2021-09-09 ENCOUNTER — Encounter: Payer: Self-pay | Admitting: Neurology

## 2021-09-09 ENCOUNTER — Ambulatory Visit: Payer: Medicaid Other | Admitting: Neurology

## 2021-09-09 VITALS — BP 110/78 | HR 76 | Ht 65.0 in | Wt 158.5 lb

## 2021-09-09 DIAGNOSIS — E559 Vitamin D deficiency, unspecified: Secondary | ICD-10-CM | POA: Diagnosis not present

## 2021-09-09 DIAGNOSIS — Z79899 Other long term (current) drug therapy: Secondary | ICD-10-CM

## 2021-09-09 DIAGNOSIS — G35 Multiple sclerosis: Secondary | ICD-10-CM

## 2021-09-09 DIAGNOSIS — G43709 Chronic migraine without aura, not intractable, without status migrainosus: Secondary | ICD-10-CM

## 2021-09-09 DIAGNOSIS — R200 Anesthesia of skin: Secondary | ICD-10-CM

## 2021-09-09 HISTORY — DX: Chronic migraine without aura, not intractable, without status migrainosus: G43.709

## 2021-09-09 MED ORDER — RIZATRIPTAN BENZOATE 10 MG PO TBDP: 10.0000 mg | ORAL_TABLET | ORAL | 11 refills | Status: DC | PRN

## 2021-09-09 MED ORDER — IMIPRAMINE HCL 25 MG PO TABS: 25.0000 mg | ORAL_TABLET | Freq: Every day | ORAL | 5 refills | Status: DC

## 2021-09-09 NOTE — Progress Notes (Signed)
[facility]  PATIENT: Jessica Wiggins DOB: Oct 21, 1990  REFERRING DOCTOR OR PCP: [name] MD SOURCE: Patient, notes from [facility] health,  _________________________________   HISTORICAL  CHIEF COMPLAINT:  Chief Complaint  Patient presents with   Follow-up    Rm 2, w mother and interpreter. Here for worsening sx. Pt c/o of generalized weakness and HA for the last 2 weeks. Was seen by PCP and nothing abnormal was found. Today pt feels better.     HISTORY OF PRESENT ILLNESS:  Jessica Wiggins, is a 31 y.o. woman with MS.  Update 09/09/2021: The visit was conducted with an interpreter.  She feels her MS is stable.  She has had more issues with headaches.  She is on tecfidera and tolerates it well.    No GI issues.  No flushing or rash.   She has noted more bruising.   No execerbations.  Her MS has been mostly stable.     Gait is doing well and no stumbles or falls.   Occasional mild off-balance but can easily walk a mile.   She can go up and down stairs without using the banister but will often use it for safety..  She gets dysesthetic pain in both  hands.  No numbness elsewhere. Gabapentin made her tired and stomach pain.   Vision is fine.  Bladder function is fine.  She has some depression and anxieity.   Cognitive function is fine.  She is also having headache and dizziness.    The headache pain is frontal, and sometimes throbbing.  It is associated with nausea and photophobia.  Moving her head makes it worse.  MS History: She presented to the [facility] ED in April 2022 with numbness below her neck and cramping in her hands bilaterally.       Initially, she also had some leg cramping.    She denied numbness,    She had an MRi of the brain showing some white matter foci, none that enhanced.   She had a telehealth consult with neurology.  Because the MRI showed some spots, she was admitted and received steroids.  She was admitted to the hospital and  had 3 days of IV Solu-medrol.    She feels better since the steroids and now only ha cramping in her hands,      She denies any similar symptoms or episodes of numbness, weakness, clumsiness, gait change or vision change n the past.   She has no FH of MS.     She is otherwise healthy and is not on any medications.     No h/o DM or HTN.   She does not smoke or drink ETOH.        Data: Imaging reviewed: CT scan of the head 11/17/2018 was normal.  MRI of the brain 11/16/2020 showed some scattered T2/FLAIR hyperintense foci in the hemispheres.  Mostly nonspecific.  A couple foci are periventricular.  None are juxtacortical.  No foci in the infratentorial white matter.  Normal enhancement pattern.  MRI cervical spine 12/17/2020 showed a T2 hyperintense focus within the central and posterior spinal cord adjacent to C3.  It does not enhance but slightly expands the cervical spinal cord.  The focus could be consistent with demyelination from transverse myelitis or multiple sclerosis  Labs: Blood work 11/16/2020 showed normal B12, TSH, unremarkable CBC and CMP.  Negative pregnancy test.  CSF 12/30/2020 showed > 5 oligoclonal bands and IgG index was elevate (0.85)   REVIEW  OF SYSTEMS: Constitutional: No fevers, chills, sweats, or change in appetite Eyes: No visual changes, double vision, eye pain Ear, nose and throat: No hearing loss, ear pain, nasal congestion, sore throat Cardiovascular: No chest pain, palpitations Respiratory:  No shortness of breath at rest or with exertion.   No wheezes GastrointestinaI: No nausea, vomiting, diarrhea, abdominal pain, fecal incontinence Genitourinary:  No dysuria, urinary retention or frequency.  No nocturia. Musculoskeletal:  No neck pain, back pain Integumentary: No rash, pruritus, skin lesions Neurological: as above Psychiatric: No depression at this time.  No anxiety Endocrine: No palpitations, diaphoresis, change in appetite, change in weigh or increased  thirst Hematologic/Lymphatic:  No anemia, purpura, petechiae. Allergic/Immunologic: No itchy/runny eyes, nasal congestion, recent allergic reactions, rashes  ALLERGIES: No Known Allergies  HOME MEDICATIONS:  Current Outpatient Medications:    Dimethyl Fumarate 240 MG CPDR, Take 240 mg by mouth 2 (two) times daily. Brand Name, Disp: , Rfl:    imipramine (TOFRANIL) 25 MG tablet, Take 1 tablet (25 mg total) by mouth at bedtime., Disp: 30 tablet, Rfl: 5   rizatriptan (MAXALT-MLT) 10 MG disintegrating tablet, Take 1 tablet (10 mg total) by mouth as needed for migraine. May repeat in 2 hours if needed.   No more than 4/week, Disp: 10 tablet, Rfl: 11  PAST MEDICAL HISTORY: History reviewed. No pertinent past medical history.  PAST SURGICAL HISTORY: Past Surgical History:  Procedure Laterality Date   LEEP     TUBAL LIGATION      FAMILY HISTORY: History reviewed. No pertinent family history.  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  Tobacco Use   Smoking status: Never   Smokeless tobacco: Never  Substance and Sexual Activity   Alcohol use: Never   Drug use: Never   Sexual activity: Not on file  Other Topics Concern   Not on file  Social History Narrative   Right handed   Lives w/ husband and children   Caffeine use: none   Social Determinants of Health   Financial Resource Strain: Not on file  Food Insecurity: Not on file  Transportation Needs: Not on file  Physical Activity: Not on file  Stress: Not on file  Social Connections: Not on file  Intimate Partner Violence: Not on file     PHYSICAL EXAM  Vitals:   09/09/21 0857  BP: 110/78  Pulse: 76  SpO2: 99%  Weight: 158 lb 8 oz (71.9 kg)  Height: 5\' 5"  (1.651 m)    Body mass index is 26.38  kg/m.   General: The patient is well-developed and well-nourished and in no acute distress  HEENT:  Head is [place]/AT.  Sclera are anicteric.    Skin: Extremities are without rash or  edema.  Neurologic Exam  Mental status: The patient is alert and oriented x 3 at the time of the examination. The patient has apparent normal recent and remote memory, with an apparently normal attention span and concentration ability.   Speech is normal.  Cranial nerves: Extraocular movements are full.   Facial symmetry is present. There is good facial sensation to soft touch bilaterally.Facial strength is normal.   No obvious hearing deficits are noted.  Motor:  Muscle bulk is normal.   Tone is normal. Strength is  5 / 5 in all 4 extremities.   Sensory: No Tinel signs at the wrist or elbows.  Now has normal sensation to touch and vibration in the arms and legs..  Coordination: Cerebellar testing reveals good finger-nose-finger and heel-to-shin bilaterally.  Gait and station: Station is normal.   Gait is normal. Tandem gait is wide..  The Romberg was negative.  Reflexes: Deep tendon reflexes are symmetric and normal bilaterally.        ASSESSMENT AND PLAN  Multiple sclerosis (HCC) - Plan: CBC with Differential/Platelet, Comprehensive metabolic panel  High risk medication use - Plan: CBC with Differential/Platelet, Comprehensive metabolic panel  Numbness  Vitamin D deficiency  Chronic migraine w/o aura w/o status migrainosus, not intractable   Continue Tecfidera.  Check CBC with differential and liver function test today.  Sometime next year we will check an MRI of the brain and compared with her previous one to determine if there is any subclinical progression.  If this is occurring we would need to change to a more efficacious disease modifying therapy.    Imiparmine for HA   If not better, try topiramate.    Rizatriptan for breakthrough  vitamin D 4000 - 5000 U daily  We discussed the  relationship between low vitamin D and risk of MS issues. Return in 6 months or sooner if there are new or worsening neurologic symptoms.  Alivia Cimino A. Epimenio Foot, MD, [facility] 09/09/2021, 12:38 PM Certified in Neurology, Clinical Neurophysiology, Sleep Medicine and Neuroimaging  [facility] [address] [place], [place] [address] [phone]

## 2021-09-10 LAB — CBC WITH DIFFERENTIAL/PLATELET
Basophils Absolute: 0.1 10*3/uL (ref 0.0–0.2)
Basos: 1 %
EOS (ABSOLUTE): 0.1 10*3/uL (ref 0.0–0.4)
Eos: 2 %
Hematocrit: 39.7 % (ref 34.0–46.6)
Hemoglobin: 13.5 g/dL (ref 11.1–15.9)
Immature Grans (Abs): 0 10*3/uL (ref 0.0–0.1)
Immature Granulocytes: 0 %
Lymphocytes Absolute: 0.8 10*3/uL (ref 0.7–3.1)
Lymphs: 12 %
MCH: 29.9 pg (ref 26.6–33.0)
MCHC: 34 g/dL (ref 31.5–35.7)
MCV: 88 fL (ref 79–97)
Monocytes Absolute: 0.6 10*3/uL (ref 0.1–0.9)
Monocytes: 10 %
Neutrophils Absolute: 4.8 10*3/uL (ref 1.4–7.0)
Neutrophils: 75 %
Platelets: 275 10*3/uL (ref 150–450)
RBC: 4.52 x10E6/uL (ref 3.77–5.28)
RDW: 13.3 % (ref 11.7–15.4)
WBC: 6.4 10*3/uL (ref 3.4–10.8)

## 2021-09-10 LAB — COMPREHENSIVE METABOLIC PANEL
ALT: 13 IU/L (ref 0–32)
AST: 14 IU/L (ref 0–40)
Albumin/Globulin Ratio: 2 (ref 1.2–2.2)
Albumin: 4.9 g/dL (ref 3.9–5.0)
Alkaline Phosphatase: 51 IU/L (ref 44–121)
BUN/Creatinine Ratio: 19 (ref 9–23)
BUN: 13 mg/dL (ref 6–20)
Bilirubin Total: 0.7 mg/dL (ref 0.0–1.2)
CO2: 22 mmol/L (ref 20–29)
Calcium: 9.1 mg/dL (ref 8.7–10.2)
Chloride: 103 mmol/L (ref 96–106)
Creatinine, Ser: 0.69 mg/dL (ref 0.57–1.00)
Globulin, Total: 2.4 g/dL (ref 1.5–4.5)
Glucose: 92 mg/dL (ref 70–99)
Potassium: 4.1 mmol/L (ref 3.5–5.2)
Sodium: 138 mmol/L (ref 134–144)
Total Protein: 7.3 g/dL (ref 6.0–8.5)
eGFR: 120 mL/min/{1.73_m2} (ref >59)

## 2021-09-13 ENCOUNTER — Telehealth: Payer: Self-pay | Admitting: Neurology

## 2021-09-13 NOTE — Telephone Encounter (Signed)
-----   Message from Asa Lente, MD sent at 09/11/2021  6:10 PM EST ----- Please let the patient know that the lab work is fine.

## 2021-09-13 NOTE — Telephone Encounter (Signed)
Called to advise the lab results looked good. Nothing concerning on labs. Pt verbalized understanding. Pt had no questions at this time but was encouraged to call back if questions arise.

## 2021-10-07 ENCOUNTER — Telehealth: Payer: Self-pay | Admitting: Neurology

## 2021-10-07 MED ORDER — TOPIRAMATE 50 MG PO TABS: 50.0000 mg | ORAL_TABLET | Freq: Every day | ORAL | 5 refills | Status: DC

## 2021-10-07 NOTE — Telephone Encounter (Signed)
Called [employer] back. Spoke w/ ID: [id number], [place].  ? ?They called pt. Relayed below recommendations:  ? ?"1.   Lets D/C the imipramine and start topiramate 50 mg tablets #60, 1 or 2 at bedtime 5 ref. If not better after a month we can try different medicine.  ?2.   We can write her for 3 days work a week for 1 month.  Hopefully the headaches will be better by then" ? ? ?Pt agreeable to plan. Aware we will call her once FMLA forms complete. I e-scribe topamax. ?

## 2021-10-07 NOTE — Telephone Encounter (Signed)
Dr. Epimenio Foot- what would you recommend? ? ?Called [employer] at [phone]. ID: [id number], Alex.  ? ?He called pt. She reports she is still having daily headaches. Confirmed she is still taking imipramine 25mg  po qhs and rizatriptan prn. Feels imipramine making her sleepy. She wakes up very tired in the morning.  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 The gripping issues are new within last 2 weeks. She is requesting to work 3 days/wk.  ? ?When she takes riztriptan, helps for short time and then headache returns. Denies any signs/sx of infection. Confirmed she is taking Tecfidera as rx'd, no missed doses.  ?

## 2021-10-07 NOTE — Telephone Encounter (Signed)
Patient presented to the lobby today dropping off FMLA paperwork. Also requesting a work letter limiting work for 3 days a week due to gripping issues and headaches. Best call back is [phone] ?

## 2021-10-12 DIAGNOSIS — Z0289 Encounter for other administrative examinations: Secondary | ICD-10-CM

## 2021-10-12 NOTE — Telephone Encounter (Signed)
Gave completed/signed form back to medical records to process for pt. 

## 2021-10-14 NOTE — Telephone Encounter (Signed)
Pt fmla form faxed on 10/14/21 ?

## 2021-10-19 NOTE — Telephone Encounter (Signed)
Pt's mother, Remus Blake (not on DPR) informing [employer] request 6 months for her be able to be out of work for 3 days for pt's headaches. ?

## 2021-10-19 NOTE — Telephone Encounter (Signed)
Jessica Wiggins, can you f/u on this? Appears you sent FMLA 5 days ago ?

## 2021-10-20 NOTE — Telephone Encounter (Signed)
I re faxed fmla form on 10/20/21 pt needs to contact benefits services center @ [phone] ?

## 2021-11-09 ENCOUNTER — Other Ambulatory Visit: Payer: Self-pay | Admitting: Neurology

## 2021-11-09 ENCOUNTER — Telehealth: Payer: Self-pay | Admitting: Neurology

## 2021-11-09 DIAGNOSIS — Z0289 Encounter for other administrative examinations: Secondary | ICD-10-CM

## 2021-11-09 MED ORDER — EMGALITY 120 MG/ML ~~LOC~~ SOAJ: 1.0000 mL | SUBCUTANEOUS | 5 refills | Status: DC

## 2021-11-09 NOTE — Telephone Encounter (Signed)
Aracely, CMA and myself contacted the pt and to discuss FMLA paperwork that was brought in. Dr Epimenio Foot had the patient on a reduced scheduled until 11/06/21 to allow time for medication to work. We called the pt to determine what else was needed on this form and she was asking if this could be written for a whole year at the reduced scheduled.  ?Advised that Dr Epimenio Foot will allow for intermittent leave r/t migraines but not consistent days of being out. Pt verbalized understanding. Pt was asked how many migraines she is having in a month. She states that she is having 2 headaches a month but they are lasting week each time. ?Pt was asked if she was taking the topiramate as prescribed and she states that she is taking 50 mg every other night. Pt was encouraged to take the medication up to 100 mg nightly for migraine/headache prevention.  ?Dr Epimenio Foot would also like to start her on emgality once a month injection. Advised the pt that she can come in on Monday and get an injection done here in the office. She plans to come on Monday. A sample was put aside with her name on it to be administered to the pt and teach her how to give the injection. Pt verbalized understanding. ?Advised FMLA paperwork was also completed and turned into [place].  ?

## 2021-11-10 ENCOUNTER — Telehealth: Payer: Self-pay | Admitting: Neurology

## 2021-11-10 NOTE — Telephone Encounter (Signed)
PA is denied  ?"You have tried one of the following drugs for at least one month: ?(A) Antidepressants (for example, amitriptyline, venlafaxine). ?(B) Beta blockers (for example, propranolol, metoprolol, timolol, atenolol). ?(C) Angiotensin converting enzyme inhibitors/angiotensin II receptor blockers (for example, ?lisinopril, candesartan). ?(D) Calcium channel blockers (for example, verapamil, nimodipine)" ? ?Pt has previously only tried gabapentin, imipramine and otc.  ?She was only taking the topamax 50 mg every other day instead of daily. Advised she should increase to the 100 mg qhs and to see if making that change to therapy helped reduce the headaches.  ?Will see if Dr Epimenio Foot would like to give this a chance first or try an additional medication from above ?

## 2021-11-10 NOTE — Telephone Encounter (Signed)
Spoke w pt and informed her that her insurance denied coverage for emgality. Since pt has not taken topiramate at prescribed. Dr. Felecia Shelling would like for her to consistently take 100mg  of Topiramate at bedtime for 1 month and if HA have improved then we would not need to add in a new medication at this time. But if HA have not, he would then try adding something else. Pt will call us in a month to let us know how she is doing. Pt verbalized understanding.  ? ?Pt would also like her FMLA ppw faxed. Fax number listed on ppw per pt.  ?

## 2021-11-10 NOTE — Telephone Encounter (Signed)
PA completed on CMM/ [facility] optum rx medicaid ?KEY: [id number] ?Will await determination ?

## 2021-11-15 NOTE — Telephone Encounter (Signed)
Pia @ [facility] is asking for a call to know if the start certification for FMLA can be as of 04-02.  Erie Noe can be reached at [phone]  ?

## 2021-11-16 NOTE — Telephone Encounter (Signed)
Addended FMLA to include start date of 11/07/21. Waiting on MD signature, then will give the medical records to process. ?

## 2021-12-15 NOTE — Patient Instructions (Signed)
Below is our plan: ? ?We will continue dimethyl fumerate 240mg  twice daily.  ? ?Please take topiramate 100mg  (two tablets) every night at bedtime for 1 month. If headaches do not improve, please call me.  ? ?Take rizatriptan only as needed to stop a bad headache. Please take 1 tablet at onset of headache. May take 1 additional tablet in 2 hours if needed. Do not take more than 2 tablets in 24 hours or more than 10 in a month.  ? ?Please do not take Excedrin every day. This can cause rebound headaches.  ? ?Please make sure you are staying well hydrated. I recommend 50-60 ounces daily. Well balanced diet and regular exercise encouraged. Consistent sleep schedule with 6-8 hours recommended.  ? ?Please continue follow up with care team as directed.  ? ?Follow up with Dr Felecia Shelling in 6 months  ? ?You may receive a survey regarding today's visit. I encourage you to leave honest feed back as I do use this information to improve patient care. Thank you for seeing me today!  ? ? ?

## 2021-12-15 NOTE — Progress Notes (Signed)
? ? ?Chief Complaint  ?Patient presents with  ? Follow-up  ?  Rm 1, w interpreter. Here for 6 month f/u for migraines and MS, on DF and tolerating well. Pt reports taking topiramate nightly but would wake up at night due to HA's. So pt completely d/c. Having LB pn. Pt needing FMLA ppw filled out.   ? ? ?HISTORY OF PRESENT ILLNESS: ? ?12/16/21 ALL:  ?Jessica Wiggins is a 31 y.o. female here today for follow up for RRMS. She continues Tecfidera and tolerates well. Last MRI cervical spine showed lesion adjacent to C3. LP confirmed > 5 oligoclonal bands. Labs 10/2021 with [facility] were unremarkable. Lymp count 0.9.  ? ?She is doing fairly well from MS standpoint. No new or worsening symptoms. Gait is normal. No numbness or tingling.  ? ?She was started on topiramate 50-100mg  daily at bedtime for migraines. She had not been consistent with dosing at last visit 08/2021. Emgality was denied. She was encouraged to take topiramate consistently for at least 1 months then determine effectiveness. She reports taking 50mg  once then stopping. She continues to have daily headaches. She has taken rizatriptan often. She is taking Excedrin daily. She is status post bilateral tubal ligation.  ? ?She endorses LBP since birth of child two years ago. She has not seen PCP. No radiculopathy or concerning red flag symptoms. She is followed by , DNP in [place].  ? ? ?HISTORY (copied from Dr Terrilyn Saver previous note) ? ?Jessica Wiggins, is a 31 y.o. woman with MS. ?  ?Update 09/09/2021: ?The visit was conducted with an interpreter.  She feels her MS is stable.  She has had more issues with headaches. ?  ?She is on tecfidera and tolerates it well.    No GI issues.  No flushing or rash.   She has noted more bruising.   No execerbations.  Her MS has been mostly stable.    ?  ?Gait is doing well and no stumbles or falls.   Occasional mild off-balance but can easily walk a mile.   She can go up and down stairs without using the  banister but will often use it for safety..  She gets dysesthetic pain in both  hands.  No numbness elsewhere. Gabapentin made her tired and stomach pain.   Vision is fine.  Bladder function is fine. ?  ?She has some depression and anxieity.   Cognitive function is fine. ?  ?She is also having headache and dizziness.    The headache pain is frontal, and sometimes throbbing.  It is associated with nausea and photophobia.  Moving her head makes it worse. ?  ?MS History: ?She presented to the [facility] ED in April 2022 with numbness below her neck and cramping in her hands bilaterally.       Initially, she also had some leg cramping.    She denied numbness,    She had an MRi of the brain showing some white matter foci, none that enhanced.   She had a telehealth consult with neurology.  Because the MRI showed some spots, she was admitted and received steroids.  She was admitted to the hospital and had 3 days of IV Solu-medrol.    She feels better since the steroids and now only ha cramping in her hands,     ?  ?She denies any similar symptoms or episodes of numbness, weakness, clumsiness, gait change or vision change n the past.   She has no FH of  MS.    ?  ?She is otherwise healthy and is not on any medications.     No h/o DM or HTN.   She does not smoke or drink ETOH.       ?  ?Data: ?Imaging reviewed: ?CT scan of the head 11/17/2018 was normal. ?  ?MRI of the brain 11/16/2020 showed some scattered T2/FLAIR hyperintense foci in the hemispheres.  Mostly nonspecific.  A couple foci are periventricular.  None are juxtacortical.  No foci in the infratentorial white matter.  Normal enhancement pattern. ?  ?MRI cervical spine 12/17/2020 showed a T2 hyperintense focus within the central and posterior spinal cord adjacent to C3.  It does not enhance but slightly expands the cervical spinal cord.  The focus could be consistent with demyelination from transverse myelitis or multiple sclerosis ?  ?Labs: ?Blood work 11/16/2020 showed  normal B12, TSH, unremarkable CBC and CMP.  Negative pregnancy test. ?  ?CSF 12/30/2020 showed > 5 oligoclonal bands and IgG index was elevate (0.85) ? ? ?REVIEW OF SYSTEMS: Out of a complete 14 system review of symptoms, the patient complains only of the following symptoms, headaches, low back pain and all other reviewed systems are negative. ? ? ?ALLERGIES: ?No Known Allergies ? ? ?HOME MEDICATIONS: ?Outpatient Medications Prior to Visit  ?Medication Sig Dispense Refill  ? rizatriptan (MAXALT-MLT) 10 MG disintegrating tablet Take 1 tablet (10 mg total) by mouth as needed for migraine. May repeat in 2 hours if needed.   No more than 4/week 10 tablet 11  ? topiramate (TOPAMAX) 50 MG tablet Take 1-2 tablets (50-100 mg total) by mouth at bedtime. 60 tablet 5  ? Dimethyl Fumarate 240 MG CPDR Take 240 mg by mouth 2 (two) times daily. Brand Name    ? Galcanezumab-gnlm (EMGALITY) 120 MG/ML SOAJ Inject 1 mL into the skin every 30 (thirty) days. 1 mL 5  ? ?No facility-administered medications prior to visit.  ? ? ? ?PAST MEDICAL HISTORY: ?History reviewed. No pertinent past medical history. ? ? ?PAST SURGICAL HISTORY: ?Past Surgical History:  ?Procedure Laterality Date  ? LEEP    ? TUBAL LIGATION    ? ? ? ?FAMILY HISTORY: ?History reviewed. No pertinent family history. ? ? ?SOCIAL HISTORY: ?Social History  ? ?Socioeconomic History  ? Marital status: Single  ?  Spouse name: Not on file  ? Number of children: 2  ? Years of education: 71  ? Highest education level: Not on file  ?Occupational History  ? 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 Smoking status: Never  ? Smokeless tobacco: Never  ?Substance and Sexual Activity  ? Alcohol use: Never  ? Drug use: Never  ? Sexual activity: Not on file  ?Other Topics Concern  ? Not on file  ?Social History Narrative  ? Right handed  ? Lives w/ husband and children  ? Caffeine use: none  ? ?Social Determinants of Health  ? ?Financial Resource Strain: Not on file  ?Food Insecurity:  Not on file  ?Transportation Needs: Not on file  ?Physical Activity: Not on file  ?Stress: Not on file  ?Social Connections: Not on file  ?Intimate Partner Violence: Not on file  ? ? ? ?PHYSICAL EXAM ? ?Vitals:  ? 12/16/21 1106  ?BP: 113/68  ?Pulse: 66  ?Weight: 160 lb 8 oz (72.8 kg)  ?Height: 5\' 5"  (1.651 m)  ? ?Body mass index is 26.71 kg/m?. ? ?Generalized: Well developed, in no acute distress ? ?Cardiology: normal rate and rhythm,  no murmur auscultated  ?Respiratory: clear to auscultation bilaterally   ? ?Neurological examination  ?Mentation: Alert oriented to time, place, history taking. Follows all commands speech and language fluent ?Cranial nerve II-XII: Pupils were equal round reactive to light. Extraocular movements were full, visual field were full on confrontational test. Facial sensation and strength were normal. Head turning and shoulder shrug  were normal and symmetric. ?Motor: The motor testing reveals 5 over 5 strength of all 4 extremities. Good symmetric motor tone is noted throughout.  ?Sensory: Sensory testing is intact to soft touch on all 4 extremities. No evidence of extinction is noted.  ?Coordination: Cerebellar testing reveals good finger-nose-finger and heel-to-shin bilaterally.  ?Gait and station: Gait is normal.  ?Reflexes: Deep tendon reflexes are symmetric and normal bilaterally.  ? ? ?DIAGNOSTIC DATA (LABS, IMAGING, TESTING) ?- I reviewed patient records, labs, notes, testing and imaging myself where available. ? ?Lab Results  ?Component Value Date  ? WBC 6.4 09/09/2021  ? HGB 13.5 09/09/2021  ? HCT 39.7 09/09/2021  ? MCV 88 09/09/2021  ? PLT 275 09/09/2021  ? ?   ?Component Value Date/Time  ? NA 138 09/09/2021 0946  ? K 4.1 09/09/2021 0946  ? CL 103 09/09/2021 0946  ? CO2 22 09/09/2021 0946  ? GLUCOSE 92 09/09/2021 0946  ? BUN 13 09/09/2021 0946  ? CREATININE 0.69 09/09/2021 0946  ? CALCIUM 9.1 09/09/2021 0946  ? PROT 7.3 09/09/2021 0946  ? ALBUMIN 4.9 09/09/2021 0946  ? ALBUMIN 3.9  12/30/2020 1057  ? AST 14 09/09/2021 0946  ? ALT 13 09/09/2021 0946  ? ALKPHOS 51 09/09/2021 0946  ? BILITOT 0.7 09/09/2021 0946  ? ?No results found for: CHOL, HDL, LDLCALC, LDLDIRECT, TRIG, CHOLHDL ?No

## 2021-12-16 ENCOUNTER — Encounter: Payer: Self-pay | Admitting: Family Medicine

## 2021-12-16 ENCOUNTER — Ambulatory Visit: Payer: Medicaid Other | Admitting: Family Medicine

## 2021-12-16 VITALS — BP 113/68 | HR 66 | Ht 65.0 in | Wt 160.5 lb

## 2021-12-16 DIAGNOSIS — Z79899 Other long term (current) drug therapy: Secondary | ICD-10-CM

## 2021-12-16 DIAGNOSIS — E559 Vitamin D deficiency, unspecified: Secondary | ICD-10-CM

## 2021-12-16 DIAGNOSIS — G35 Multiple sclerosis: Secondary | ICD-10-CM

## 2021-12-16 DIAGNOSIS — G43709 Chronic migraine without aura, not intractable, without status migrainosus: Secondary | ICD-10-CM | POA: Diagnosis not present

## 2021-12-16 DIAGNOSIS — R200 Anesthesia of skin: Secondary | ICD-10-CM

## 2021-12-16 NOTE — Telephone Encounter (Signed)
Addended FMLA to include incapacitated dates from 2/22-2/24 per pts request.  ? ?Faxed to [phone] and received fax confirmation.  ?

## 2021-12-17 LAB — VITAMIN D 25 HYDROXY (VIT D DEFICIENCY, FRACTURES): Vit D, 25-Hydroxy: 34.7 ng/mL (ref 30.0–100.0)

## 2021-12-21 ENCOUNTER — Telehealth: Payer: Self-pay

## 2021-12-21 NOTE — Telephone Encounter (Signed)
-----   Message from Debbora Presto, NP sent at 12/20/2021  4:11 PM EDT ----- ?Vitamin D level looks ok. It is on the lower side of normal. We will continue to monitor this closely. Because it was low last year, it may not be a bad idea to have her take a supplement of vitamin D over the counter. I would recommend 800iu daily. She could get this individually or in a multivitamin over the counter. TY! ?

## 2021-12-21 NOTE — Telephone Encounter (Signed)
This encounter was created in error - please disregard.

## 2021-12-21 NOTE — Telephone Encounter (Signed)
Pt called back and call was transferred from [place]. Results were relayed.  ? ?Pt then informs me that after her visit on 5/12 she too her topiramate that night and was having trouble sleeping, was having chest tightness and muscle cramps and burning. she ended up going to the ED and they suggested she continue her medication. Pt would like to make Amy aware and see if she wanted her to continue medication.  ? ?Per Amy's recommendation, "those are not typical symptoms of topiramate. if they checked her out and everything ok, I would not be opposed to her continuing topiramate. it will take at least a month to help headaches. As long as she can tolerate it have her continue for 1 month. If any significant symptoms of chest pain or trouble breathing persist, discontinue."  ? ?Message was relayed and pt will start taking medication again tonight. Pt verbalized understanding and had no further questions at this time.  ?

## 2021-12-21 NOTE — Progress Notes (Signed)
Called no answer LVM

## 2021-12-21 NOTE — Telephone Encounter (Signed)
-----   Message from Amy Lomax, NP sent at 12/20/2021  4:11 PM EDT ----- ?Vitamin D level looks ok. It is on the lower side of normal. We will continue to monitor this closely. Because it was low last year, it may not be a bad idea to have her take a supplement of vitamin D over the counter. I would recommend 800iu daily. She could get this individually or in a multivitamin over the counter. TY! ?

## 2021-12-21 NOTE — Telephone Encounter (Signed)
Called and no answer. LVM of results per Shawnie Dapper, NP. Provided call back number if needed. Call can be transferred to me if she calls back w/ any concerns.  ?

## 2022-01-21 DIAGNOSIS — R6889 Other general symptoms and signs: Secondary | ICD-10-CM | POA: Insufficient documentation

## 2022-01-21 DIAGNOSIS — R61 Generalized hyperhidrosis: Secondary | ICD-10-CM | POA: Insufficient documentation

## 2022-02-01 ENCOUNTER — Telehealth: Payer: Self-pay | Admitting: Family Medicine

## 2022-02-01 ENCOUNTER — Other Ambulatory Visit: Payer: Self-pay | Admitting: Neurology

## 2022-02-01 MED ORDER — DIMETHYL FUMARATE 240 MG PO CPDR: 240.0000 mg | DELAYED_RELEASE_CAPSULE | Freq: Two times a day (BID) | ORAL | 5 refills | Status: DC

## 2022-02-18 IMAGING — XA DG SPINAL PUNCT LUMBAR DIAG WITH FL CT GUIDANCE
1 series · 1 of 1 positions shown · non-contrast
Comparison: None

CLINICAL DATA: Patient with a history of numbness below her neck
and cramping in her hands bilaterally. Evaluation for possible
multiple sclerosis.

EXAM:
DIAGNOSTIC LUMBAR PUNCTURE UNDER FLUOROSCOPIC GUIDANCE

[Series 1: ortho adipose · 1 of 1 slices shown]
[im 1/1]
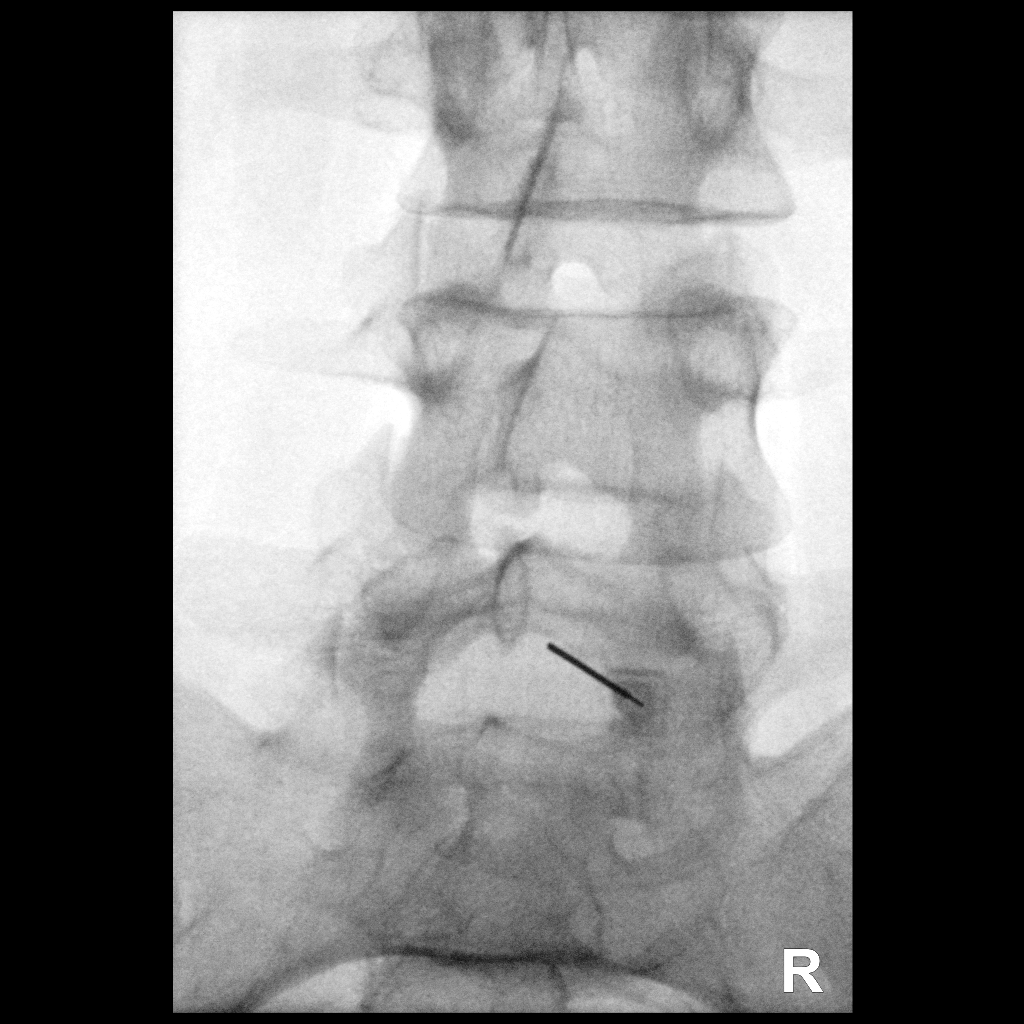

[1 of 1 positions shown; findings below may reference images not displayed]

FLUOROSCOPY TIME:  Fluoroscopy Time:  14 seconds

Radiation Exposure Index (if provided by the fluoroscopic device):
1.1 mGy

Number of Acquired Spot Images: 0

PROCEDURE:
Informed consent was obtained from the patient prior to the
procedure, including potential complications of headache, allergy,
and pain. With the patient prone, the lower back was prepped with
Betadine. 1% Lidocaine was used for local anesthesia. Lumbar
puncture was performed at the L5-S1 level using a 3.5 inch 20 gauge
needle via a right interlaminar approach with return of clear CSF
with an opening pressure of 14 cm water (measured in the left
lateral decubitus position). 7.5 mL of CSF were obtained for
laboratory studies. The patient tolerated the procedure well and
there were no apparent complications.
IMPRESSION: Technically successful fluoroscopically guided lumbar puncture.

Read by: Ndetshi Ishuna, NP

## 2022-07-07 ENCOUNTER — Ambulatory Visit: Payer: Medicaid Other | Admitting: Neurology

## 2022-09-01 ENCOUNTER — Other Ambulatory Visit: Payer: Self-pay | Admitting: Neurology

## 2022-09-01 NOTE — Telephone Encounter (Signed)
Follow up scheduled on 11/14/22 with Dr. Felecia Shelling.

## 2022-09-15 ENCOUNTER — Other Ambulatory Visit (HOSPITAL_BASED_OUTPATIENT_CLINIC_OR_DEPARTMENT_OTHER): Payer: Self-pay

## 2022-09-15 ENCOUNTER — Other Ambulatory Visit: Payer: Self-pay

## 2022-09-15 ENCOUNTER — Encounter (HOSPITAL_BASED_OUTPATIENT_CLINIC_OR_DEPARTMENT_OTHER): Payer: Self-pay | Admitting: Emergency Medicine

## 2022-09-15 ENCOUNTER — Emergency Department (HOSPITAL_BASED_OUTPATIENT_CLINIC_OR_DEPARTMENT_OTHER)
Admission: EM | Admit: 2022-09-15 | Discharge: 2022-09-15 | Disposition: A | Discharge status: Home / Self Care | Payer: Medicaid Other | Attending: Emergency Medicine | Admitting: Emergency Medicine

## 2022-09-15 DIAGNOSIS — R112 Nausea with vomiting, unspecified: Secondary | ICD-10-CM | POA: Insufficient documentation

## 2022-09-15 DIAGNOSIS — R197 Diarrhea, unspecified: Secondary | ICD-10-CM | POA: Diagnosis not present

## 2022-09-15 HISTORY — DX: Migraine, unspecified, not intractable, without status migrainosus: G43.909

## 2022-09-15 HISTORY — DX: Multiple sclerosis: G35

## 2022-09-15 LAB — CBC WITH DIFFERENTIAL/PLATELET
Abs Immature Granulocytes: 0.02 10*3/uL (ref 0.00–0.07)
Basophils Absolute: 0 10*3/uL (ref 0.0–0.1)
Basophils Relative: 1 %
Eosinophils Absolute: 0 10*3/uL (ref 0.0–0.5)
Eosinophils Relative: 1 %
HCT: 42.4 % (ref 36.0–46.0)
Hemoglobin: 14.1 g/dL (ref 12.0–15.0)
Immature Granulocytes: 0 %
Lymphocytes Relative: 11 %
Lymphs Abs: 0.6 10*3/uL — ABNORMAL LOW (ref 0.7–4.0)
MCH: 29.6 pg (ref 26.0–34.0)
MCHC: 33.3 g/dL (ref 30.0–36.0)
MCV: 88.9 fL (ref 80.0–100.0)
Monocytes Absolute: 0.7 10*3/uL (ref 0.1–1.0)
Monocytes Relative: 13 %
Neutro Abs: 4 10*3/uL (ref 1.7–7.7)
Neutrophils Relative %: 74 %
Platelets: 235 10*3/uL (ref 150–400)
RBC: 4.77 MIL/uL (ref 3.87–5.11)
RDW: 13.5 % (ref 11.5–15.5)
WBC: 5.3 10*3/uL (ref 4.0–10.5)
nRBC: 0 % (ref 0.0–0.2)

## 2022-09-15 LAB — COMPREHENSIVE METABOLIC PANEL
ALT: 26 U/L (ref 0–44)
AST: 21 U/L (ref 15–41)
Albumin: 4.8 g/dL (ref 3.5–5.0)
Alkaline Phosphatase: 54 U/L (ref 38–126)
Anion gap: 15 (ref 5–15)
BUN: 15 mg/dL (ref 6–20)
CO2: 21 mmol/L — ABNORMAL LOW (ref 22–32)
Calcium: 9.2 mg/dL (ref 8.9–10.3)
Chloride: 103 mmol/L (ref 98–111)
Creatinine, Ser: 0.72 mg/dL (ref 0.44–1.00)
GFR, Estimated: >60 mL/min (ref >60)
Glucose, Bld: 94 mg/dL (ref 70–99)
Potassium: 3.3 mmol/L — ABNORMAL LOW (ref 3.5–5.1)
Sodium: 139 mmol/L (ref 135–145)
Total Bilirubin: 0.5 mg/dL (ref 0.3–1.2)
Total Protein: 7.8 g/dL (ref 6.5–8.1)

## 2022-09-15 LAB — HCG, SERUM, QUALITATIVE: Preg, Serum: NEGATIVE

## 2022-09-15 LAB — LIPASE, BLOOD: Lipase: 16 U/L (ref 11–51)

## 2022-09-15 MED ORDER — SODIUM CHLORIDE 0.9 % IV BOLUS
1000.0000 mL | Freq: Once | INTRAVENOUS | Status: AC
  Administered 2022-09-15: 1000 mL via INTRAVENOUS

## 2022-09-15 MED ORDER — ONDANSETRON HCL 4 MG/2ML IJ SOLN
4.0000 mg | Freq: Once | INTRAMUSCULAR | Status: AC
  Administered 2022-09-15: 4 mg via INTRAVENOUS
  Filled 2022-09-15: qty 2

## 2022-09-15 MED ORDER — ONDANSETRON HCL 4 MG PO TABS
4.0000 mg | ORAL_TABLET | Freq: Four times a day (QID) | ORAL | 0 refills | Status: DC
  Filled 2022-09-15: qty 12, 3d supply, fill #0

## 2022-09-15 NOTE — ED Triage Notes (Signed)
Pt arrives to ED with c/o upper abdominal pain, diarrhea, and weakness x2 days.

## 2022-09-15 NOTE — ED Provider Notes (Signed)
[facility] Provider Note   CSN: [id number] Arrival date & time: 09/15/22  0805     History  Chief Complaint  Patient presents with   Abdominal Pain   Diarrhea   Weakness    Jessica Wiggins is a 32 y.o. female.  Patient here with abdominal pain, nausea, vomiting, diarrhea.  Symptoms last day or 2.  No significant medical history.  Denies any fevers or chills.  No sick contacts or suspicious food intake.  Denies any chest pain or shortness of breath or numbness or weakness.  Nothing makes it worse or better.  The history is provided by the patient.       Home Medications Prior to Admission medications   Medication Sig Start Date End Date Taking? Authorizing Provider  ondansetron (ZOFRAN) 4 MG tablet Take 1 tablet (4 mg total) by mouth every 6 (six) hours. 09/15/22  Yes Agatha Duplechain, DO  Dimethyl Fumarate 240 MG CPDR TAKE 1 CAPSULE BY MOUTH IN THE  MORNING AND AT BEDTIME 09/01/22   Sater, Nanine Means, MD  rizatriptan (MAXALT-MLT) 10 MG disintegrating tablet Take 1 tablet (10 mg total) by mouth as needed for migraine. May repeat in 2 hours if needed.   No more than 4/week 09/09/21   Sater, Nanine Means, MD  topiramate (TOPAMAX) 50 MG tablet Take 1-2 tablets (50-100 mg total) by mouth at bedtime. 10/07/21   Sater, Nanine Means, MD      Allergies    Patient has no known allergies.    Review of Systems   Review of Systems  Physical Exam Updated Vital Signs BP 101/79 (BP Location: Right Arm)   Pulse 69   Temp 98.1 F (36.7 C) (Oral)   Resp 18   Ht 5\' 5"  (1.651 m)   Wt 65.8 kg   SpO2 98%   BMI 24.13 kg/m  Physical Exam Vitals and nursing note reviewed.  Constitutional:      General: She is not in acute distress.    Appearance: She is well-developed. She is not ill-appearing.  HENT:     Head: Normocephalic and atraumatic.     Nose: Nose normal.     Mouth/Throat:     Mouth: Mucous membranes are moist.  Eyes:     Extraocular  Movements: Extraocular movements intact.     Conjunctiva/sclera: Conjunctivae normal.     Pupils: Pupils are equal, round, and reactive to light.  Cardiovascular:     Rate and Rhythm: Normal rate and regular rhythm.     Pulses: Normal pulses.     Heart sounds: Normal heart sounds. No murmur heard. Pulmonary:     Effort: Pulmonary effort is normal. No respiratory distress.     Breath sounds: Normal breath sounds.  Abdominal:     Palpations: Abdomen is soft.     Tenderness: There is no abdominal tenderness.  Musculoskeletal:        General: No swelling.     Cervical back: Neck supple.  Skin:    General: Skin is warm and dry.     Capillary Refill: Capillary refill takes less than 2 seconds.  Neurological:     General: No focal deficit present.     Mental Status: She is alert.  Psychiatric:        Mood and Affect: Mood normal.     ED Results / Procedures / Treatments   Labs (all labs ordered are listed, but only abnormal results are displayed) Labs Reviewed  CBC  WITH DIFFERENTIAL/PLATELET - Abnormal; Notable for the following components:      Result Value   Lymphs Abs 0.6 (*)    All other components within normal limits  COMPREHENSIVE METABOLIC PANEL - Abnormal; Notable for the following components:   Potassium 3.3 (*)    CO2 21 (*)    All other components within normal limits  LIPASE, BLOOD  HCG, SERUM, QUALITATIVE    EKG None  Radiology No results found.  Procedures Procedures    Medications Ordered in ED Medications  sodium chloride 0.9 % bolus 1,000 mL (1,000 mLs Intravenous New Bag/Given 09/15/22 0822)  ondansetron [employer]) injection 4 mg (4 mg Intravenous Given 09/15/22 1740)    ED Course/ Medical Decision Making/ A&P                             Medical Decision Making Amount and/or Complexity of Data Reviewed Labs: ordered.  Risk Prescription drug management.   Jessica Wiggins is here with abdominal pain, nausea, vomiting.  Normal vitals.  No  fever.  Differential diagnosis likely foodborne illness versus viral gastroenteritis, seems less likely to be appendicitis or other acute intra-abdominal process at this time.  Will check CBC, CMP, lipase, urinalysis.  Will give IV fluids and IV Zofran.  Will reevaluate to see if any need for advanced imaging.  Abdominal exam is overall benign.  Per my review interpretation the labs is no significant anemia, electrolyte abnormality, kidney injury or leukocytosis.  Overall my suspicion is for viral gastroenteritis/foodborne illness.  Recommend continue Zofran as needed at home.  Bland diet, oral hydration.  Understands return precautions.  No concern for appendicitis or other acute intra-abdominal process.  She has no fever no white count and no focal abdominal tenderness on exam.  Understands return precautions.  Discharged in good condition.  This chart was dictated using voice recognition software.  Despite best efforts to proofread,  errors can occur which can change the documentation meaning.         Final Clinical Impression(s) / ED Diagnoses Final diagnoses:  Nausea vomiting and diarrhea    Rx / DC Orders ED Discharge Orders          Ordered    ondansetron (ZOFRAN) 4 MG tablet  Every 6 hours        09/15/22 0859              Lennice Sites, DO 09/15/22 [id number]

## 2022-09-15 NOTE — Discharge Instructions (Addendum)
Lab work today is normal.  My suspicion is that you have a viral/foodborne illness that should self resolve here in the next few days.  I have prescribed you Zofran which is a medicine for nausea.  Take as prescribed.  Return if you develop more severe symptoms or other concerning symptoms.

## 2022-11-24 ENCOUNTER — Ambulatory Visit (INDEPENDENT_AMBULATORY_CARE_PROVIDER_SITE_OTHER): Payer: Medicaid Other | Admitting: Neurology

## 2022-11-24 ENCOUNTER — Encounter: Payer: Self-pay | Admitting: Neurology

## 2022-11-24 VITALS — BP 100/69 | HR 75 | Ht 65.0 in | Wt 146.0 lb

## 2022-11-24 DIAGNOSIS — G35 Multiple sclerosis: Secondary | ICD-10-CM | POA: Diagnosis not present

## 2022-11-24 DIAGNOSIS — R200 Anesthesia of skin: Secondary | ICD-10-CM

## 2022-11-24 DIAGNOSIS — E559 Vitamin D deficiency, unspecified: Secondary | ICD-10-CM | POA: Diagnosis not present

## 2022-11-24 DIAGNOSIS — Z79899 Other long term (current) drug therapy: Secondary | ICD-10-CM | POA: Diagnosis not present

## 2022-11-24 DIAGNOSIS — M791 Myalgia, unspecified site: Secondary | ICD-10-CM

## 2022-11-24 NOTE — Progress Notes (Signed)
Dr. Epimenio Foot provided pt completed/signed reasonable accommodation health care provider questionnaire form while here for appt. Copy sent to MR to be scanned.

## 2022-11-24 NOTE — Progress Notes (Signed)
[facility]  PATIENT: Jessica Wiggins DOB: 1991-05-21  REFERRING DOCTOR OR PCP: [name] MD SOURCE: Patient, notes from [facility],  _________________________________   HISTORICAL  CHIEF COMPLAINT:  Chief Complaint  Patient presents with   Room 10    Pt is here with Interpreter. 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 Pt has paperwork for Dr. Epimenio Foot to sign so she won't have to be in the freezer. Pt states that her balance is off. Pt states that when she is in the freezer she can't breathe well. Pt states no burning sensation in her arms,legs, or feet.     HISTORY OF PRESENT ILLNESS:  Jessica Wiggins, is a 32 y.o. woman with MS.  Update 11/24/2022: The visit was conducted with an interpreter.   She is on Tecfidera.  She feels her MS is stable and no new neurologic symptoms.  No flushing or rash.   She had N/V and wnet to te ED early 2024.  She was felt to have a viral syndrome and is better.   Gait is doing well and no stumbles or falls.  Balance is slightly off at times, worse in cold temps   She can go up and down stairs without using the banister .Marland Kitchen  No numbness or tingling  Vision is fine.  Bladder function is fine.  She gets muscle cramps and worse balance in cold temperatures.  This occurs at work going back and forth between room and freezer.    She has some depression and anxieity.   Cognitive function is fine.  She has occasional Headache, better than last year.       MS History: She presented to the [facility] ED in April 2022 with numbness below her neck and cramping in her hands bilaterally.       Initially, she also had some leg cramping.    She denied numbness,    She had an MRi of the brain showing some white matter foci, none that enhanced.   She had a telehealth consult with neurology.  Because the MRI showed some spots, she was admitted and received steroids.  She  was admitted to the hospital and had 3 days of IV Solu-medrol.    She feels better since the steroids and now only ha cramping in her hands,      She denies any similar symptoms or episodes of numbness, weakness, clumsiness, gait change or vision change n the past.   She has no FH of MS.     She is otherwise healthy and is not on any medications.     No h/o DM or HTN.   She does not smoke or drink ETOH.        Data: Imaging reviewed: CT scan of the head 11/17/2018 was normal.  MRI of the brain 11/16/2020 showed some scattered T2/FLAIR hyperintense foci in the hemispheres.  Mostly nonspecific.  A couple foci are periventricular.  None are juxtacortical.  No foci in the infratentorial white matter.  Normal enhancement pattern.  MRI cervical spine 12/17/2020 showed a T2 hyperintense focus within the central and posterior spinal cord adjacent to C3.  It does not enhance but slightly expands the cervical spinal cord.  The focus could be consistent with demyelination from transverse myelitis or multiple sclerosis  Labs: Blood work 11/16/2020 showed normal B12, TSH, unremarkable CBC and CMP.  Negative pregnancy test.  CSF 12/30/2020 showed >  5 oligoclonal bands and IgG index was elevate (0.85)   REVIEW OF SYSTEMS: Constitutional: No fevers, chills, sweats, or change in appetite Eyes: No visual changes, double vision, eye pain Ear, nose and throat: No hearing loss, ear pain, nasal congestion, sore throat Cardiovascular: No chest pain, palpitations Respiratory:  No shortness of breath at rest or with exertion.   No wheezes GastrointestinaI: No nausea, vomiting, diarrhea, abdominal pain, fecal incontinence Genitourinary:  No dysuria, urinary retention or frequency.  No nocturia. Musculoskeletal:  No neck pain, back pain Integumentary: No rash, pruritus, skin lesions Neurological: as above Psychiatric: No depression at this time.  No anxiety Endocrine: No palpitations, diaphoresis, change in  appetite, change in weigh or increased thirst Hematologic/Lymphatic:  No anemia, purpura, petechiae. Allergic/Immunologic: No itchy/runny eyes, nasal congestion, recent allergic reactions, rashes  ALLERGIES: No Known Allergies  HOME MEDICATIONS:  Current Outpatient Medications:    Dimethyl Fumarate 240 MG CPDR, TAKE 1 CAPSULE BY MOUTH IN THE  MORNING AND AT BEDTIME, Disp: 60 capsule, Rfl: 2   ondansetron (ZOFRAN) 4 MG tablet, Take 1 tablet (4 mg total) by mouth every 6 (six) hours. (Patient not taking: Reported on 11/24/2022), Disp: 12 tablet, Rfl: 0   rizatriptan (MAXALT-MLT) 10 MG disintegrating tablet, Take 1 tablet (10 mg total) by mouth as needed for migraine. May repeat in 2 hours if needed.   No more than 4/week (Patient not taking: Reported on 11/24/2022), Disp: 10 tablet, Rfl: 11   topiramate (TOPAMAX) 50 MG tablet, Take 1-2 tablets (50-100 mg total) by mouth at bedtime. (Patient not taking: Reported on 11/24/2022), Disp: 60 tablet, Rfl: 5  PAST MEDICAL HISTORY: Past Medical History:  Diagnosis Date   Migraine    Multiple sclerosis     PAST SURGICAL HISTORY: Past Surgical History:  Procedure Laterality Date   LEEP     TUBAL LIGATION      FAMILY HISTORY: History reviewed. No pertinent family history.  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  Tobacco Use   Smoking status: Never   Smokeless tobacco: Never  Substance and Sexual Activity   Alcohol use: Never   Drug use: Never   Sexual activity: Not on file  Other Topics Concern   Not on file  Social History Narrative   Right handed   Lives w/ husband and children   Caffeine use: none   Social Determinants of Health   Financial Resource Strain: Not on file  Food Insecurity: Not on file   Transportation Needs: Not on file  Physical Activity: Not on file  Stress: Not on file  Social Connections: Not on file  Intimate Partner Violence: Not on file     PHYSICAL EXAM  Vitals:   11/24/22 1132  BP: 100/69  Pulse: 75  Weight: 146 lb (66.2 kg)  Height:  (1.651 m)    Body mass index is 24.3 kg/m.   General: The patient is well-developed and well-nourished and in no acute distress  HEENT:  Head is [place]/AT.  Sclera are anicteric.    Skin: Extremities are without rash or  edema.  Neurologic Exam  Mental status: The patient is alert and oriented x 3 at the time of the examination. The patient has apparent normal recent and remote memory,  with an apparently normal attention span and concentration ability.   Speech is normal.  Cranial nerves: Extraocular movements are full.   Facial symmetry is present. There is good facial sensation to soft touch bilaterally.Facial strength is normal.   No obvious hearing deficits are noted.  Motor:  Muscle bulk is normal.   Tone is normal. Strength is  5 / 5 in all 4 extremities.   Sensory: No Tinel signs at the wrist or elbows.  Now has normal sensation to touch and vibration in the arms and legs..  Coordination: Cerebellar testing reveals good finger-nose-finger and heel-to-shin bilaterally.  Gait and station: Station is normal.   Gait is normal. Tandem gait is wide..  The Romberg was negative.  Reflexes: Deep tendon reflexes are symmetric and normal bilaterally.        ASSESSMENT AND PLAN  Multiple sclerosis - Plan: CBC with Differential/Platelet, MR BRAIN W WO CONTRAST  High risk medication use - Plan: CBC with Differential/Platelet  Vitamin D deficiency  Numbness  Myalgia   Continue Tecfidera.  Check CBC with differentia.   Check an MRI of the brain and compared with her previous one to determine if there is any subclinical progression.  If this is occurring we would need to change to a more efficacious disease  modifying therapy.   Continue Vit D supplements Work accomodatin - avoid cod temoeraure and swinging between freezer and room Return in 6 months or sooner if there are new or worsening neurologic symptoms.  Geraldy Akridge A. Epimenio Foot, MD, [facility] 11/24/2022, 12:03 PM Certified in Neurology, Clinical Neurophysiology, Sleep Medicine and Neuroimaging  [facility] [address] [place], [place] [address] [phone]

## 2022-11-25 LAB — CBC WITH DIFFERENTIAL/PLATELET
Basophils Absolute: 0 10*3/uL (ref 0.0–0.2)
Basos: 0 %
EOS (ABSOLUTE): 0.1 10*3/uL (ref 0.0–0.4)
Eos: 2 %
Hematocrit: 39.3 % (ref 34.0–46.6)
Hemoglobin: 12.8 g/dL (ref 11.1–15.9)
Immature Grans (Abs): 0.1 10*3/uL (ref 0.0–0.1)
Immature Granulocytes: 1 %
Lymphocytes Absolute: 1.2 10*3/uL (ref 0.7–3.1)
Lymphs: 19 %
MCH: 29.4 pg (ref 26.6–33.0)
MCHC: 32.6 g/dL (ref 31.5–35.7)
MCV: 90 fL (ref 79–97)
Monocytes Absolute: 0.6 10*3/uL (ref 0.1–0.9)
Monocytes: 10 %
Neutrophils Absolute: 4.1 10*3/uL (ref 1.4–7.0)
Neutrophils: 68 %
Platelets: 246 10*3/uL (ref 150–450)
RBC: 4.36 x10E6/uL (ref 3.77–5.28)
RDW: 13 % (ref 11.7–15.4)
WBC: 6 10*3/uL (ref 3.4–10.8)

## 2022-11-28 ENCOUNTER — Other Ambulatory Visit: Payer: Self-pay | Admitting: Neurology

## 2022-11-29 ENCOUNTER — Telehealth: Payer: Self-pay | Admitting: *Deleted

## 2022-11-29 ENCOUNTER — Telehealth: Payer: Self-pay | Admitting: Neurology

## 2022-11-29 NOTE — Telephone Encounter (Signed)
-----   Message from Asa Lente, MD sent at 11/25/2022  8:42 AM EDT ----- Please let the patient know that the lab work is fine.

## 2022-11-29 NOTE — Telephone Encounter (Signed)
Called interpreter services at [phone]. Spoke London Sheer ID: [id number]. She called pt at [phone]. Mailbox full, unable to LVM. She tried calling again. Was able to speak with pt and relay results. Pt asked about MRI ordered. Advised once approved via insurance, someone will call her to get that scheduled. She verbalized understanding. Pt has no further questions.

## 2022-11-29 NOTE — Telephone Encounter (Signed)
[employer] medicaid Berkley Harvey: [id number] exp. 11/29/22-01/13/23 sent to GI [phone]

## 2022-12-01 ENCOUNTER — Telehealth: Payer: Self-pay | Admitting: Neurology

## 2022-12-01 NOTE — Telephone Encounter (Signed)
Correction**  Pt work states form must say pt cannot work in conditions of 30 degrees or below.

## 2022-12-01 NOTE — Telephone Encounter (Signed)
Pt came in about forms that were filled out for her work on 11/24/2022. Pts work has told her on Page 2 Question 6 where it states pt should not be working in freezing conditions, a specific tempeture needs to be provided. Pt states 50 degrees or lower would be best to put on the form as conditions pt could not work in. Pt aware Dr. Epimenio Foot will not be in office until Tuesday 4/30 and forms will be addressed then.  Would like papers faxed once fixed.  Fax [phone]

## 2022-12-01 NOTE — Telephone Encounter (Signed)
Dr. Epimenio Foot addended form stating for pt to "avoid less than 50 degrees, needs to work at 50 degrees or higher". Gave to medical records to process updated form for pt.

## 2023-01-06 ENCOUNTER — Ambulatory Visit
Admission: RE | Admit: 2023-01-06 | Discharge: 2023-01-06 | Disposition: A | Discharge status: Home / Self Care | Payer: Medicaid Other | Source: Ambulatory Visit | Attending: Neurology | Admitting: Neurology

## 2023-01-06 DIAGNOSIS — G35 Multiple sclerosis: Secondary | ICD-10-CM

## 2023-01-06 MED ORDER — GADOPICLENOL 0.5 MMOL/ML IV SOLN
7.5000 mL | Freq: Once | INTRAVENOUS | Status: AC | PRN
  Administered 2023-01-06: 7.5 mL via INTRAVENOUS

## 2023-05-30 ENCOUNTER — Encounter: Payer: Self-pay | Admitting: Neurology

## 2023-05-30 ENCOUNTER — Ambulatory Visit: Payer: Medicaid Other | Admitting: Neurology

## 2023-05-30 VITALS — BP 101/66 | HR 69 | Ht 62.0 in | Wt 158.5 lb

## 2023-05-30 DIAGNOSIS — R200 Anesthesia of skin: Secondary | ICD-10-CM

## 2023-05-30 DIAGNOSIS — G35 Multiple sclerosis: Secondary | ICD-10-CM

## 2023-05-30 DIAGNOSIS — Z79899 Other long term (current) drug therapy: Secondary | ICD-10-CM | POA: Diagnosis not present

## 2023-05-30 DIAGNOSIS — E559 Vitamin D deficiency, unspecified: Secondary | ICD-10-CM

## 2023-05-30 DIAGNOSIS — G43709 Chronic migraine without aura, not intractable, without status migrainosus: Secondary | ICD-10-CM | POA: Diagnosis not present

## 2023-05-30 NOTE — Progress Notes (Signed)
[facility]  PATIENT: Jessica Wiggins DOB: Dec 26, 1990  REFERRING DOCTOR OR PCP: [name] MD SOURCE: Patient, notes from [facility],  _________________________________   HISTORICAL  CHIEF COMPLAINT:  Chief Complaint  Patient presents with   Follow-up    Pt in room 10, video interpreter [phone] in room.Here for MS follow up on Tecfidera. Pt reports no concerns, she is doing well.    HISTORY OF PRESENT ILLNESS:  Jessica Wiggins, is a 32 y.o. woman with MS.  Update 05/31/2023: The visit was conducted with an interpreter.   She is on Tecfidera. She feels her MS is stable and no new neurologic symptoms.  No flushing or rash.    Gait is doing well and no stumbles or falls.  Balance is slightly off but she can go up and down stairs without using the banister .Marland Kitchen  No numbness or tingling  Vision is fine.  Bladder function is fine.  She no longer has to go ina nd out of a freezer at work and her muscle cramps are not a problem now.  This was occurring at work going back and forth between room and freezer.    She denies depression and anxieity.   Cognitive function is fine.  No longer having headaches    MS History: She presented to the [facility] ED in April 2022 with numbness below her neck and cramping in her hands bilaterally.       Initially, she also had some leg cramping.    She denied numbness,    She had an MRi of the brain showing some white matter foci, none that enhanced.   She had a telehealth consult with neurology.  Because the MRI showed some spots, she was admitted and received steroids.  She was admitted to the hospital and had 3 days of IV Solu-medrol.    She feels better since the steroids and now only ha cramping in her hands,      She denies any similar symptoms or episodes of numbness, weakness, clumsiness, gait change or vision change n the past.   She has no FH of MS.     She is otherwise healthy and is not on any  medications.     No h/o DM or HTN.   She does not smoke or drink ETOH.        Data: Imaging reviewed: CT scan of the head 11/17/2018 was normal.  MRI of the brain 11/16/2020 showed some scattered T2/FLAIR hyperintense foci in the hemispheres.  Mostly nonspecific.  A couple foci are periventricular.  None are juxtacortical.  No foci in the infratentorial white matter.  Normal enhancement pattern.  MRI cervical spine 12/17/2020 showed a T2 hyperintense focus within the central and posterior spinal cord adjacent to C3.  It does not enhance but slightly expands the cervical spinal cord.  The focus could be consistent with demyelination from transverse myelitis or multiple sclerosis  MRI brain 12/2022 showed Multiple T2/FLAIR hyperintense foci in the periventricular, subcortical and deep white matter of the cerebral hemispheres. None of the foci enhance or appear to be acute. The periventricular foci are consistent with chronic demyelinating plaque associated with multiple sclerosis. Other foci are more nonspecific though could be seen with multiple sclerosis as well. Compared to the MRI from 11/16/2020, there are no new lesions.   Labs: Blood work 11/16/2020 showed normal B12, TSH, unremarkable CBC and CMP.  Negative pregnancy test.  CSF 12/30/2020 showed > 5 oligoclonal bands and  IgG index was elevate (0.85)   REVIEW OF SYSTEMS: Constitutional: No fevers, chills, sweats, or change in appetite Eyes: No visual changes, double vision, eye pain Ear, nose and throat: No hearing loss, ear pain, nasal congestion, sore throat Cardiovascular: No chest pain, palpitations Respiratory:  No shortness of breath at rest or with exertion.   No wheezes GastrointestinaI: No nausea, vomiting, diarrhea, abdominal pain, fecal incontinence Genitourinary:  No dysuria, urinary retention or frequency.  No nocturia. Musculoskeletal:  No neck pain, back pain Integumentary: No rash, pruritus, skin lesions Neurological: as  above Psychiatric: No depression at this time.  No anxiety Endocrine: No palpitations, diaphoresis, change in appetite, change in weigh or increased thirst Hematologic/Lymphatic:  No anemia, purpura, petechiae. Allergic/Immunologic: No itchy/runny eyes, nasal congestion, recent allergic reactions, rashes  ALLERGIES: No Known Allergies  HOME MEDICATIONS:  Current Outpatient Medications:    Dimethyl Fumarate 240 MG CPDR, TAKE 1 CAPSULE BY MOUTH IN THE  MORNING AND AT BEDTIME, Disp: 60 capsule, Rfl: 5   ondansetron (ZOFRAN) 4 MG tablet, Take 1 tablet (4 mg total) by mouth every 6 (six) hours. (Patient not taking: Reported on 11/24/2022), Disp: 12 tablet, Rfl: 0   rizatriptan (MAXALT-MLT) 10 MG disintegrating tablet, Take 1 tablet (10 mg total) by mouth as needed for migraine. May repeat in 2 hours if needed.   No more than 4/week (Patient not taking: Reported on 11/24/2022), Disp: 10 tablet, Rfl: 11   topiramate (TOPAMAX) 50 MG tablet, Take 1-2 tablets (50-100 mg total) by mouth at bedtime. (Patient not taking: Reported on 11/24/2022), Disp: 60 tablet, Rfl: 5  PAST MEDICAL HISTORY: Past Medical History:  Diagnosis Date   Migraine    Multiple sclerosis (HCC)     PAST SURGICAL HISTORY: Past Surgical History:  Procedure Laterality Date   LEEP     TUBAL LIGATION      FAMILY HISTORY: No family history on file.  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  Tobacco Use   Smoking status: Never   Smokeless tobacco: Never  Substance and Sexual Activity   Alcohol use: Never   Drug use: Never   Sexual activity: Not on file  Other Topics Concern   Not on file  Social History Narrative   Right handed   Lives w/ husband and children   Caffeine use: none   Social Determinants  of Health   Financial Resource Strain: Not on file  Food Insecurity: Not on file  Transportation Needs: Not on file  Physical Activity: Not on file  Stress: Not on file  Social Connections: Unknown (12/15/2021)   Received from [facility], [facility]   Social Network    Social Network: Not on file  Intimate Partner Violence: Unknown (11/12/2021)   Received from [employer], [facility]   HITS    Physically Hurt: Not on file    Insult or Talk Down To: Not on file    Threaten Physical Harm: Not on file    Scream or Curse: Not on file     PHYSICAL EXAM  Vitals:   05/30/23 1046  BP: 101/66  Pulse: 69  Weight: 158 lb 8 oz (71.9 kg)  Height: 5\' 2"  (1.575 m)    Body mass index is 28.99 kg/m.  General: The patient is well-developed and well-nourished and in no acute distress  HEENT:  Head is [place]/AT.  Sclera are anicteric.    Skin: Extremities are without rash or  edema.  Neurologic Exam  Mental status: The patient is alert and oriented x 3 at the time of the examination. The patient has apparent normal recent and remote memory, with an apparently normal attention span and concentration ability.   Speech is normal.  Cranial nerves: Extraocular movements are full.   Facial symmetry is present. There is good facial sensation to soft touch bilaterally.Facial strength is normal.   No obvious hearing deficits are noted.  Motor:  Muscle bulk is normal.   Tone is normal. Strength is  5 / 5 in all 4 extremities.   Sensory: No Tinel signs at the wrist or elbows.  Now has normal sensation to touch and vibration in the arms and legs..  Coordination: Cerebellar testing reveals good finger-nose-finger and heel-to-shin bilaterally.  Gait and station: Station is normal.   Gait is normal. Tandem gait is wide..  The Romberg is negative..  Reflexes: Deep tendon reflexes are symmetric and normal bilaterally.        ASSESSMENT AND PLAN  Multiple sclerosis (HCC)  High risk  medication use  Vitamin D deficiency  Chronic migraine w/o aura w/o status migrainosus, not intractable  Numbness   Continue Tecfidera.  Check CBC with differentia and Hepatic.   Check an MRI of the brain and compared with her previous one to determine if there is any subclinical progression.  If this is occurring we would need to change to a more efficacious disease modifying therapy.   Continue Vit D supplements Stay active and exercise as tolerated Return in 6 months or sooner if there are new or worsening neurologic symptoms.  Shakeena Kafer A. Epimenio Foot, MD, [facility] 05/30/2023, 11:04 AM Certified in Neurology, Clinical Neurophysiology, Sleep Medicine and Neuroimaging  [facility] [address] [place], [place] [address] [phone]

## 2023-05-31 ENCOUNTER — Telehealth: Payer: Self-pay

## 2023-05-31 LAB — HEPATIC FUNCTION PANEL
ALT: 12 [IU]/L (ref 0–32)
AST: 15 [IU]/L (ref 0–40)
Albumin: 4.5 g/dL (ref 3.9–4.9)
Alkaline Phosphatase: 55 [IU]/L (ref 44–121)
Bilirubin Total: 0.3 mg/dL (ref 0.0–1.2)
Bilirubin, Direct: 0.11 mg/dL (ref 0.00–0.40)
Total Protein: 6.8 g/dL (ref 6.0–8.5)

## 2023-05-31 LAB — CBC WITH DIFFERENTIAL/PLATELET
Basophils Absolute: 0 10*3/uL (ref 0.0–0.2)
Basos: 1 %
EOS (ABSOLUTE): 0.2 10*3/uL (ref 0.0–0.4)
Eos: 3 %
Hematocrit: 39.7 % (ref 34.0–46.6)
Hemoglobin: 13 g/dL (ref 11.1–15.9)
Immature Grans (Abs): 0 10*3/uL (ref 0.0–0.1)
Immature Granulocytes: 0 %
Lymphocytes Absolute: 0.7 10*3/uL (ref 0.7–3.1)
Lymphs: 14 %
MCH: 30.2 pg (ref 26.6–33.0)
MCHC: 32.7 g/dL (ref 31.5–35.7)
MCV: 92 fL (ref 79–97)
Monocytes Absolute: 0.6 10*3/uL (ref 0.1–0.9)
Monocytes: 12 %
Neutrophils Absolute: 3.6 10*3/uL (ref 1.4–7.0)
Neutrophils: 70 %
Platelets: 225 10*3/uL (ref 150–450)
RBC: 4.31 x10E6/uL (ref 3.77–5.28)
RDW: 12.4 % (ref 11.7–15.4)
WBC: 5.1 10*3/uL (ref 3.4–10.8)

## 2023-05-31 NOTE — Telephone Encounter (Signed)
Tried to call pt with Interpreter [phone] to give lab results, was unable to LVM 05/31/2023

## 2023-06-03 ENCOUNTER — Other Ambulatory Visit: Payer: Self-pay | Admitting: Neurology

## 2023-06-05 NOTE — Telephone Encounter (Signed)
Last seen on 05/30/23 Follow up scheduled on 12/07/23

## 2023-10-04 ENCOUNTER — Emergency Department (HOSPITAL_BASED_OUTPATIENT_CLINIC_OR_DEPARTMENT_OTHER)
Admission: EM | Admit: 2023-10-04 | Discharge: 2023-10-04 | Disposition: A | Discharge status: Home / Self Care | Payer: Medicaid Other | Attending: Emergency Medicine | Admitting: Emergency Medicine

## 2023-10-04 ENCOUNTER — Other Ambulatory Visit: Payer: Self-pay

## 2023-10-04 ENCOUNTER — Encounter (HOSPITAL_BASED_OUTPATIENT_CLINIC_OR_DEPARTMENT_OTHER): Payer: Self-pay

## 2023-10-04 DIAGNOSIS — M79672 Pain in left foot: Secondary | ICD-10-CM | POA: Insufficient documentation

## 2023-10-04 DIAGNOSIS — M79671 Pain in right foot: Secondary | ICD-10-CM | POA: Insufficient documentation

## 2023-10-04 DIAGNOSIS — R21 Rash and other nonspecific skin eruption: Secondary | ICD-10-CM | POA: Insufficient documentation

## 2023-10-04 MED ORDER — IBUPROFEN 600 MG PO TABS: 600.0000 mg | ORAL_TABLET | Freq: Four times a day (QID) | ORAL | 0 refills | Status: DC | PRN

## 2023-10-04 MED ORDER — IBUPROFEN 400 MG PO TABS
600.0000 mg | ORAL_TABLET | Freq: Once | ORAL | Status: AC
  Administered 2023-10-04: 600 mg via ORAL
  Filled 2023-10-04: qty 1

## 2023-10-04 MED ORDER — OXYCODONE-ACETAMINOPHEN 5-325 MG PO TABS
1.0000 | ORAL_TABLET | Freq: Once | ORAL | Status: AC
  Administered 2023-10-04: 1 via ORAL
  Filled 2023-10-04: qty 1

## 2023-10-04 MED ORDER — OXYCODONE HCL 5 MG PO TABS: 5.0000 mg | ORAL_TABLET | Freq: Four times a day (QID) | ORAL | 0 refills | Status: AC | PRN

## 2023-10-04 NOTE — ED Triage Notes (Signed)
 In for eval of intermittent red bumps to soles of bilateral feet since Monday. Has had some other episodes but has not been seen for this condition yet. PMH MS.   Video interpreter used - spanish Geanie Kenning # [phone]

## 2023-10-04 NOTE — ED Provider Notes (Signed)
 [place] EMERGENCY DEPARTMENT AT [facility] Provider Note   CSN: [id number] Arrival date & time: 10/04/23  1605     History  Chief Complaint  Patient presents with   Foot Pain    Jessica Wiggins is a 33 y.o. female.  Patient is a 33 year old female with past medical history of MS presenting for red bumps on soles of bilateral feet x 4 days.  Patient states areas of pain are larger than a quarter, with minimal redness-purple discoloration, and multiple poor foot.  She denies any wounds or injuries.  She denies any recent antibiotics or medication changes.  She denies any autoimmune diseases.  She denies any hand rashes, fevers, mouth rashes, or any other symptoms.  The history is provided by the patient. No language interpreter was used.  Foot Pain Pertinent negatives include no chest pain, no abdominal pain and no shortness of breath.       Home Medications Prior to Admission medications   Medication Sig Start Date End Date Taking? Authorizing Provider  Dimethyl Fumarate 240 MG CPDR TAKE 1 CAPSULE BY MOUTH IN THE  MORNING AND AT BEDTIME 06/05/23  Yes Sater, Pearletha Furl, MD  ibuprofen (ADVIL) 600 MG tablet Take 1 tablet (600 mg total) by mouth every 6 (six) hours as needed for mild pain (pain score 1-3). 10/04/23  Yes Edwin Dada P, DO  oxyCODONE (ROXICODONE) 5 MG immediate release tablet Take 1 tablet (5 mg total) by mouth every 6 (six) hours as needed for up to 3 days for severe pain (pain score 7-10). 10/04/23 10/07/23 Yes Edwin Dada P, DO  ondansetron (ZOFRAN) 4 MG tablet Take 1 tablet (4 mg total) by mouth every 6 (six) hours. Patient not taking: Reported on 11/24/2022 09/15/22   Virgina Norfolk, DO  rizatriptan (MAXALT-MLT) 10 MG disintegrating tablet Take 1 tablet (10 mg total) by mouth as needed for migraine. May repeat in 2 hours if needed.   No more than 4/week Patient not taking: Reported on 11/24/2022 09/09/21   Asa Lente, MD  topiramate (TOPAMAX) 50 MG  tablet Take 1-2 tablets (50-100 mg total) by mouth at bedtime. Patient not taking: Reported on 11/24/2022 10/07/21   Asa Lente, MD      Allergies    Patient has no known allergies.    Review of Systems   Review of Systems  Constitutional:  Negative for chills and fever.  HENT:  Negative for ear pain and sore throat.   Eyes:  Negative for pain and visual disturbance.  Respiratory:  Negative for cough and shortness of breath.   Cardiovascular:  Negative for chest pain and palpitations.  Gastrointestinal:  Negative for abdominal pain and vomiting.  Genitourinary:  Negative for dysuria and hematuria.  Musculoskeletal:  Negative for arthralgias and back pain.  Skin:  Positive for rash. Negative for color change.  Neurological:  Negative for seizures and syncope.  All other systems reviewed and are negative.   Physical Exam Updated Vital Signs BP 110/75 (BP Location: Right Arm)   Pulse 67   Temp 98.2 F (36.8 C) (Oral)   Resp 16   LMP 09/29/2023 (Exact Date)   SpO2 99%  Physical Exam Vitals and nursing note reviewed.  Constitutional:      General: She is not in acute distress.    Appearance: She is well-developed.  HENT:     Head: Normocephalic and atraumatic.  Eyes:     Conjunctiva/sclera: Conjunctivae normal.  Cardiovascular:     Rate  and Rhythm: Normal rate and regular rhythm.     Heart sounds: No murmur heard. Pulmonary:     Effort: Pulmonary effort is normal. No respiratory distress.     Breath sounds: Normal breath sounds.  Abdominal:     Palpations: Abdomen is soft.     Tenderness: There is no abdominal tenderness.  Musculoskeletal:        General: No swelling.     Cervical back: Neck supple.       Feet:  Skin:    General: Skin is warm and dry.     Capillary Refill: Capillary refill takes less than 2 seconds.  Neurological:     Mental Status: She is alert.  Psychiatric:        Mood and Affect: Mood normal.     ED Results / Procedures / Treatments    Labs (all labs ordered are listed, but only abnormal results are displayed) Labs Reviewed  CBC WITH DIFFERENTIAL/PLATELET  BASIC METABOLIC PANEL    EKG None  Radiology No results found.  Procedures Procedures    Medications Ordered in ED Medications  oxyCODONE-acetaminophen (PERCOCET/ROXICET) 5-325 MG per tablet 1 tablet (1 tablet Oral Given 10/04/23 2219)  ibuprofen (ADVIL) tablet 600 mg (600 mg Oral Given 10/04/23 2219)    ED Course/ Medical Decision Making/ A&P                                 Medical Decision Making Amount and/or Complexity of Data Reviewed Labs: ordered.  Risk Prescription drug management.   33 year old female presenting for red bumps on soles of bilateral feet x 4 days.  On exam patient is alert and oriented x 3, no acute distress, afebrile, stable vital signs.  Physical exam demonstrates 3-4 regions of erythema, nonraised, regions that are painful on the soles of her bilateral feet.  No open wounds.  The rash is not seen in any other regions including no pretibial subcutaneous nodules.  It almost appears like erythema nodosum but on the plantar surfaces of the feet.  She denies a history of any autoimmune related diseases, fevers, joint pain, or rashes in any other location of the body.  She has not had these before.  Does not look like a syphilis rash.  Low suspicion hand-foot-and-mouth.  Recommend close follow-up with dermatology.  Local clinic provided.  Patient given for pain control.  Patient in no distress and overall condition improved here in the ED. Detailed discussions were had with the patient regarding current findings, and need for close f/u with PCP or on call doctor. The patient has been instructed to return immediately if the symptoms worsen in any way for re-evaluation. Patient verbalized understanding and is in agreement with current care plan. All questions answered prior to discharge.         Final Clinical Impression(s) / ED  Diagnoses Final diagnoses:  Pain in both feet  Rash    Rx / DC Orders ED Discharge Orders          Ordered    ibuprofen (ADVIL) 600 MG tablet  Every 6 hours PRN        10/04/23 2318    oxyCODONE (ROXICODONE) 5 MG immediate release tablet  Every 6 hours PRN        10/04/23 2318              Franne Forts, DO 10/04/23 2319

## 2023-11-20 ENCOUNTER — Encounter: Payer: Self-pay | Admitting: Cardiology

## 2023-11-20 DIAGNOSIS — G43909 Migraine, unspecified, not intractable, without status migrainosus: Secondary | ICD-10-CM | POA: Insufficient documentation

## 2023-11-21 ENCOUNTER — Ambulatory Visit: Attending: Cardiology | Admitting: Cardiology

## 2023-11-21 ENCOUNTER — Encounter: Payer: Self-pay | Admitting: Cardiology

## 2023-11-21 VITALS — BP 116/72 | HR 66 | Ht 65.0 in | Wt 167.8 lb

## 2023-11-21 DIAGNOSIS — R002 Palpitations: Secondary | ICD-10-CM | POA: Diagnosis not present

## 2023-11-21 DIAGNOSIS — G35 Multiple sclerosis: Secondary | ICD-10-CM | POA: Diagnosis not present

## 2023-11-21 DIAGNOSIS — R079 Chest pain, unspecified: Secondary | ICD-10-CM

## 2023-11-21 NOTE — Progress Notes (Signed)
 Cardiology Office Note:    Date:  11/21/2023   ID:  Jessica Wiggins, DOB 1990/11/23, MRN [id number]  PCP:  Sherlie Ban, NP  Cardiologist:  Garwin Brothers, MD   Referring MD: Sherlie Ban, NP    ASSESSMENT:    1. Chest pain, unspecified type   2. Palpitations   3. Multiple sclerosis (HCC)    PLAN:    In order of problems listed above:  Primary prevention stressed with the patient.  Importance of compliance with diet medication stressed and patient verbalized standing. Chest pain: Atypical in nature: Paucity of risk factors.  Reassured and will do an exercise stress echo.  She is agreeable. Palpitations: They have resolved significantly.  She underwent ZIO monitoring and we are waiting for the report from primary care.  Lab work and emergency room records reviewed. Obesity: Weight reduction stressed diet emphasized and she promises to do better. Patient will be seen in follow-up appointment in 6 months or earlier if the patient has any concerns.    Medication Adjustments/Labs and Tests Ordered: Current medicines are reviewed at length with the patient today.  Concerns regarding medicines are outlined above.  Orders Placed This Encounter  Procedures   EKG 12-Lead   No orders of the defined types were placed in this encounter.    History of Present Illness:    Jessica Wiggins is a 33 y.o. female who is being seen today for the evaluation of palpitations and chest pain at the request of Sherlie Ban, NP.  Patient is a pleasant 33 year old female.  She has past medical history of multiple sclerosis.  Patient mentions to me that she has palpitations on and off and underwent Zio monitoring.  No chest pain orthopnea or PND.  She occasionally has chest discomfort but it is not related to exertion.  She is concerned about it.  No radiation to the neck or to the arms.  She also wants reassurance and evaluation for her chest pain.  At the time of my evaluation, the  patient is alert awake oriented and in no distress.  She has no history of hypertension dyslipidemia or diabetes mellitus.  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  - will need colpo and likely biopsies in postpartum period     Migraine    Multiple sclerosis (HCC)    Numbness 05/06/2021   Vitamin D deficiency 01/18/2021    Past Surgical History:  Procedure Laterality Date   LEEP     TUBAL LIGATION      Current Medications: Current Meds  Medication Sig   benzonatate (TESSALON) 100 MG capsule Take 100 mg by mouth every 8 (eight) hours as needed for cough.   butalbital-acetaminophen-caffeine (FIORICET) 50-325-40 MG tablet Take 1 tablet by mouth as needed for headache.   dicyclomine (BENTYL) 20 MG tablet Take 20 mg by mouth 2 (two) times daily.   Dimethyl Fumarate 240 MG CPDR TAKE 1 CAPSULE BY MOUTH IN THE  MORNING AND AT BEDTIME   fluticasone (FLONASE) 50 MCG/ACT nasal spray Place 2 sprays into both nostrils daily.   ibuprofen (ADVIL) 600 MG tablet Take 1 tablet (600 mg total)  by mouth every 6 (six) hours as needed for mild pain (pain score 1-3).   imipramine (TOFRANIL) 25 MG tablet Take 25 mg by mouth at bedtime.   levocetirizine (XYZAL) 5 MG tablet Take 5 mg by mouth at bedtime.   ondansetron (ZOFRAN) 4 MG tablet Take 1 tablet (4 mg total) by mouth every 6 (six) hours.   rizatriptan (MAXALT-MLT) 10 MG disintegrating tablet Take 1 tablet (10 mg total) by mouth as needed for migraine. May repeat in 2 hours if needed.   No more than 4/week   topiramate (TOPAMAX) 50 MG tablet Take 1-2 tablets (50-100 mg total) by mouth at bedtime.     Allergies:   Patient has no known allergies.    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  Tobacco Use   Smoking status: Never   Smokeless tobacco: Never  Vaping Use   Vaping status: Never Used  Substance and Sexual Activity   Alcohol use: Never   Drug use: Never   Sexual activity: Not on file  Other Topics Concern   Not on file  Social History Narrative   Right handed   Lives w/ husband and children   Caffeine use: none   Social Drivers of Corporate investment banker Strain: Not on file  Food Insecurity: Not on file  Transportation Needs: Not on file  Physical Activity: Not on file  Stress: Not on file  Social Connections: Unknown (12/15/2021)   Received from [facility], [facility]   Social Network    Social Network: Not on file     Family History: The patient's family history includes Diabetes in her maternal grandmother and mother; Hypertension in her father, maternal grandmother, and mother.  ROS:   Please see the history of present illness.    All other systems reviewed and are negative.  EKGs/Labs/Other Studies Reviewed:    The following studies were reviewed today:  EKG Interpretation Date/Time:  Tuesday November 21 2023 08:37:10 EDT Ventricular Rate:  66 PR Interval:  130 QRS Duration:  76 QT Interval:  382 QTC Calculation: 400 R Axis:   55  Text Interpretation: Normal sinus rhythm Normal ECG No previous ECGs available Confirmed by Belva Crome [phone]) on 11/21/2023 8:50:17 AM     Recent Labs: 05/30/2023: ALT 12; Hemoglobin 13.0; Platelets 225  Recent Lipid Panel No results found for: "CHOL", "TRIG", "HDL", "CHOLHDL", "VLDL", "LDLCALC", "LDLDIRECT"  Physical Exam:    VS:  There were no vitals taken for this visit.    Wt Readings from Last 3 Encounters:  05/30/23 158 lb 8 oz (71.9 kg)   11/24/22 146 lb (66.2 kg)  09/15/22 145 lb (65.8 kg)     GEN: Patient is in no acute distress HEENT: Normal NECK: No JVD; No carotid bruits LYMPHATICS: No lymphadenopathy CARDIAC: S1 S2 regular, 2/6 systolic murmur at the apex. RESPIRATORY:  Clear to auscultation without rales, wheezing or rhonchi  ABDOMEN: Soft, non-tender, non-distended MUSCULOSKELETAL:  No edema; No deformity  SKIN: Warm and dry NEUROLOGIC:  Alert and oriented x 3 PSYCHIATRIC:  Normal affect    Signed, Garwin Brothers, MD  11/21/2023 9:11 AM    [facility]

## 2023-11-21 NOTE — Patient Instructions (Addendum)
 Traducido por [employer]  Instrucciones sobre la medicacin: Su mdico recomienda que contine con sus medicamentos actuales segn las indicaciones. Consulte la lista de medicamentos actuales que le entregaron hoy. *Si necesita resurtir sus medicamentos para el corazn antes de su prxima cita, llame a su farmacia*.  Anlisis de laboratorio:  Su mdico le recomienda que hoy se haga un BMP en el consultorio.  Si hoy se le realizan [employer] de sangre y sus resultados son Insurance account manager, recibir los [place] solo por:  Mensaje de MyChart (si tiene MyChart) Cheron Schaumann copia impresa por correo Si algn anlisis de laboratorio resulta anormal o necesitamos cambiar su tratamiento, le llamaremos para revisar los [place].  Pruebas/Procedimientos:  Instrucciones para el ecocardiograma de esfuerzo:  1. Puede tomar todos sus medicamentos.  2. No consuma alimentos, bebidas ni tabaco durante las cuatro horas previas a la prueba.  3. Vstase adecuadamente para hacer ejercicio. Se recomienda usar un conjunto de dos piezas y zapatillas deportivas. Las zapatillas deben ser cerradas.  4. Por favor traiga todos los medicamentos recetados actuales.Your physician recommends that you continue on your current medications as directed. Please refer to the Current Medication list given to you today.  *If you need a refill on your cardiac medications before your next appointment, please call your pharmacy*  Seguimiento: En [facility], usted y sus necesidades de salud son [place] prioridad. Como parte de nuestra misin continua de brindarle una atencin cardaca excepcional, hemos creado Equipos de Atencin de Proveedores designados. Estos Equipos de Atencin incluyen a su cardilogo de cabecera (mdico) y profesionales de la salud de Cabin crew (APP, por sus siglas en ingls), quienes trabajan en conjunto para brindarle la atencin que necesita, cuando la necesita.  Le recomendamos registrarse en  el portal para pacientes "MyChart". La informacin para registrarse se encuentra en este Resumen Posterior a la Visita. MyChart se utiliza para conectar con pacientes para visitas virtuales (telemedicina). Los pacientes pueden ver resultados de anlisis de laboratorio, notas de encuentros, prximas citas, etc. Tambin se pueden enviar mensajes no urgentes a su proveedor.  Para obtener ms informacin sobre las funciones de MyChart, visite [email or web address].  Su prxima cita:  12 meses  Formato de su prxima cita:  Presencial  Proveedor:  Belva Crome, MD  Ecocardiograma de esfuerzo Exercise Stress Echocardiogram Un ecocardiograma de esfuerzo es un estudio que se realiza para verificar qu tan bien funciona su corazn. Este estudio [place] ondas sonoras (a las que se denomina "ultrasonido") y Neomia Dear computadora para obtener imgenes de su corazn. Estas imgenes se toman antes y despus de Materials engineer. Para este estudio, deber caminar en una cinta caminadora o andar en una bicicleta fija para acelerar sus latidos cardacos. Mientras haga ejercicio, le controlarn la frecuencia y el ritmo cardacos con un electrocardiograma o ECG. Se le puede hacer esta prueba en los siguientes casos: Tiene dolor en el pecho u otros sntomas de un problema cardaco. Si hace poco tiempo tuvo un infarto de miocardio o una ciruga cardaca. Tiene problemas en las vlvulas cardacas. Tiene latidos cardacos acelerados o irregulares. Estas se denominan "palpitaciones". Tiene problemas debido a una insuficiencia cardaca. Informe al mdico acerca de lo siguiente: Cualquier alergia que tenga. Todos los medicamentos que [place]. Estos incluyen vitaminas, hierbas, gotas oftlmicas, cremas y [address] de [address]. Cualquier problema que usted o los [employer] de su familia hayan tenido con el uso de anestesia. Cualquier problema de sangrado que tenga. Cirugas a las que se haya sometido. Cualquier afeccin  mdica que tenga.  Si est embarazada o podra estarlo. Cules son los riesgos? El mdico hablar con usted [employer]. Pueden incluir: Dolor de [employer]. Sentirse mareado o aturdido. Falta de aire. Latidos cardacos acelerados o irregulares. Vmitos o ganas de vomitar. Infarto de miocardio. Esto ocurre en muy contadas ocasiones. Qu ocurre antes de esta prueba? Medicamentos Consulte si debe cambiar o suspender los medicamentos que toma. Si [place] un inhalador, Nurse, mental health momento de la prueba. Indicaciones generales Use ropa y zapatos para caminar cmodos. Siga las instrucciones sobre qu puede comer y beber. No debe beber ni comer nada con cafena. Deje de consumir cafena 24 horas antes del estudio. No fume, vapee ni consuma ningn producto que tenga nicotina o tabaco durante al [employer] 4 horas antes del [place]. Si necesita ayuda para dejar de consumir estos productos, hable con su mdico. Qu ocurre durante la prueba?  Se quitar la ropa de la cintura para [place] y se pondr una bata de hospital. Conley Rolls colocarn parches [place], llamados electrodos en el cuerpo. Estos se conectarn a la mquina de ECG. Le colocarn un tensimetro en el brazo. Antes de Materials engineer, Radio broadcast assistant estudio cardaco. Javier Glazier. Le pasarn un transductor con gel sobre el pecho. La computadora [employer] sonoras del transductor para obtener imgenes. Luego, comenzar a Materials engineer. Es posible que deba caminar sobre una cinta caminadora o pedalear en una bicicleta. Le controlarn la presin arterial y la frecuencia cardaca mientras hace ejercicio. El ejercicio se volver ms difcil o ms rpido. Usted har ejercicio hasta que: Su frecuencia cardaca alcance un nivel determinado. Est demasiado cansado para continuar. No pueda continuar debido a que tiene [employer], o se siente dbil o mareado. Se recostar de inmediato para que se puedan tomar ms imgenes  del corazn. La prueba puede variar segn el mdico y el hospital. Ladell Heads sucede despus del estudio? Estar bajo observacin atenta hasta que se vaya. Esto incluye controlar la presin arterial, la frecuencia cardaca y Air traffic controller, y el nivel de oxgeno en la sangre. Pregunte cundo estarn [employer] del estudio y qu debe hacer para obtenerlos. Es posible que Lawyer o ver a su mdico para hablar de Multimedia programmer. Esta informacin no tiene Theme park manager el consejo del mdico. Asegrese de hacerle al mdico cualquier pregunta que tenga. Document Revised: 11/03/2022 Document Reviewed: 11/03/2022 Elsevier Patient Education  2024 Elsevier Inc.  Medication Instructions:  Your physician recommends that you continue on your current medications as directed. Please refer to the Current Medication list given to you today.  Lab Work: Your physician recommends that you have a BMP today in the office.  If you have labs (blood work) drawn today and your tests are completely normal, you will receive your results only by: MyChart Message (if you have MyChart) OR A paper copy in the mail If you have any lab test that is abnormal or we need to change your treatment, we will call you to review the results.   Testing/Procedures:  Stress Echocardiogram Instructions:    1. You may take all of your medications.  2. No food, drink or tobacco products four hours prior to your test.  3. Dress prepared to exercise. Best to wear 2 piece outfit and tennis shoes. Shoes must be closed toe.  4. Please bring all current prescription medications.  Follow-Up: At [facility], you and your health needs are our priority.  As part of our continuing mission to provide you  with exceptional heart care, we have created designated Provider Care Teams.  These Care Teams include your primary Cardiologist (physician) and Advanced Practice Providers (APPs -  Physician Assistants and Nurse  Practitioners) who all work together to provide you with the care you need, when you need it.  We recommend signing up for the patient portal called "MyChart".  Sign up information is provided on this After Visit Summary.  MyChart is used to connect with patients for Virtual Visits (Telemedicine).  Patients are able to view lab/test results, encounter notes, upcoming appointments, etc.  Non-urgent messages can be sent to your provider as well.   To learn more about what you can do with MyChart, go to [email or web address].    Your next appointment:   12 month(s)  The format for your next appointment:   In Person  Provider:   Hillis Lu, MD   Other Instructions Exercise Stress Echocardiogram An exercise stress echocardiogram is a test to check how well your heart is working. This test uses sound waves and a computer to make pictures of your heart. These pictures will be taken before and after you exercise. For this test, you will walk on a treadmill or ride a bicycle to make your heart beat faster. While you exercise, your heart will be checked with an electrocardiogram (ECG). Your blood pressure will also be checked. You may have this test if: You have chest pain or a heart problem. You had a heart attack or heart surgery not long ago. You have heart valve problems. You have a condition that causes narrowing of the blood vessels that supply your heart. You have a high risk of heart disease and: You are starting a new exercise program. You need to have a big surgery. Tell a doctor about: Any allergies you have. All medicines you are taking. This includes vitamins, herbs, eye drops, creams, and over-the-counter medicines. Any problems you or family members have had with medicines that make you fall asleep (anesthetic medicines). Any surgeries you have had. Any blood disorders you have. Any medical conditions you have. Whether you are pregnant or may be pregnant. What are the  risks? Generally, this is a safe test. However, problems may occur, including: Chest pain. Feeling dizzy or light-headed. Shortness of breath. Increased or irregular heartbeat. Feeling like you may vomit (nausea) or vomiting. Heart attack. This is very rare. What happens before the test? Medicines Ask your doctor about changing or stopping your normal medicines. This is important if you take diabetes medicines or blood thinners. If you use an inhaler, bring it to the test. General instructions Wear comfortable clothes and walking shoes. Follow instructions from your doctor about what you cannot eat or drink before the test. Do not drink or eat anything that has caffeine in it. Stop having caffeine 24 hours before the test. Do not smoke or use products that contain nicotine or tobacco for 4 hours before the test. If you need help quitting, ask your doctor. What happens during the test?  You will take off your clothes from the waist up and put on a hospital gown. Electrodes or patches will be put on your chest. A blood pressure cuff will be put on your arm. Before you exercise, a computer will make a picture of your heart. To do this: You will lie down and a gel will be put on your chest. A wand will be moved over the gel. Sound waves from the wand will go to the computer to  make the picture. Then, you will start to exercise. You may walk on a treadmill or pedal a bicycle. Your blood pressure and heart rhythm will be checked while you exercise. The exercise will get harder or faster. You will exercise until: Your heart reaches a certain level. You are too tired to go on. You cannot go on because of chest pain, weakness, or dizziness. You will lie down right away so another picture of your heart can be taken. The procedure may vary among doctors and hospitals. What can I expect after the test? After your test, it is common to have: Mild soreness. Mild tiredness. Your heart rate  and blood pressure will be checked until they return to your normal levels. You should not have any new symptoms after this test. Follow these instructions at home: If your doctor says that you can, you may: Eat what you normally eat. Do your normal activities. Take over-the-counter and prescription medicines only as told by your doctor. Keep all follow-up visits. It is up to you to get the results of your test. Ask how to get your results when they are ready. Contact a doctor if: You feel dizzy or light-headed. You have a fast or irregular heartbeat. You feel like you may vomit or you vomit. You have a headache. You feel short of breath. Get help right away if: You develop pain or pressure: In your chest. In your jaw or neck. Between your shoulders. That goes down your left arm. You faint. You have trouble breathing. These symptoms may be an emergency. Get medical help right away. Call your local emergency services (911 in the [place] Do not wait to see if the symptoms will go away. Do not drive yourself to the hospital. Summary This is a test that checks how well your heart is working. Follow instructions about what you cannot eat or drink before the test. Ask your doctor if you should take your normal medicines before the test. Stop having caffeine 24 hours before the test. Do not smoke or use products with nicotine or tobacco in them for 4 hours before the test. During the test, your blood pressure and heart rhythm will be checked while you exercise. This information is not intended to replace advice given to you by your health care provider. Make sure you discuss any questions you have with your health care provider. Document Revised: 04/07/2021 Document Reviewed: 03/17/2020 Elsevier Patient Education  2022 [employer].

## 2023-11-22 LAB — BASIC METABOLIC PANEL WITH GFR
BUN/Creatinine Ratio: 20 (ref 9–23)
BUN: 14 mg/dL (ref 6–20)
CO2: 21 mmol/L (ref 20–29)
Calcium: 9.1 mg/dL (ref 8.7–10.2)
Chloride: 105 mmol/L (ref 96–106)
Creatinine, Ser: 0.71 mg/dL (ref 0.57–1.00)
Glucose: 87 mg/dL (ref 70–99)
Potassium: 4.3 mmol/L (ref 3.5–5.2)
Sodium: 140 mmol/L (ref 134–144)
eGFR: 117 mL/min/{1.73_m2} (ref >59)

## 2023-11-24 ENCOUNTER — Telehealth: Payer: Self-pay

## 2023-11-24 NOTE — Telephone Encounter (Signed)
-----   Message from Jennifer SAUNDERS Revankar sent at 11/22/2023  8:41 AM EDT ----- The results of the study is unremarkable. Please inform patient. I will discuss in detail at next appointment. Cc  primary care/referring physician Jennifer SAUNDERS Crape, MD 11/22/2023 8:41 AM

## 2023-11-24 NOTE — Telephone Encounter (Signed)
 No answer or VM

## 2023-11-27 ENCOUNTER — Other Ambulatory Visit: Payer: Self-pay | Admitting: Neurology

## 2023-11-27 NOTE — Telephone Encounter (Signed)
 Last seen on 05/30/23 Follow up scheduled on 12/07/23

## 2023-12-07 ENCOUNTER — Encounter: Payer: Self-pay | Admitting: Neurology

## 2023-12-07 ENCOUNTER — Ambulatory Visit (INDEPENDENT_AMBULATORY_CARE_PROVIDER_SITE_OTHER): Payer: Medicaid Other | Admitting: Neurology

## 2023-12-07 VITALS — BP 120/68 | HR 73 | Ht 65.0 in | Wt 164.0 lb

## 2023-12-07 DIAGNOSIS — Z79899 Other long term (current) drug therapy: Secondary | ICD-10-CM

## 2023-12-07 DIAGNOSIS — G35D Multiple sclerosis, unspecified: Secondary | ICD-10-CM

## 2023-12-07 DIAGNOSIS — G35 Multiple sclerosis: Secondary | ICD-10-CM | POA: Diagnosis not present

## 2023-12-07 DIAGNOSIS — R200 Anesthesia of skin: Secondary | ICD-10-CM

## 2023-12-07 DIAGNOSIS — M791 Myalgia, unspecified site: Secondary | ICD-10-CM

## 2023-12-07 DIAGNOSIS — E559 Vitamin D deficiency, unspecified: Secondary | ICD-10-CM

## 2023-12-07 NOTE — Progress Notes (Signed)
 [facility]  PATIENT: Jessica Wiggins DOB: February 13, 1991  REFERRING DOCTOR OR PCP: Vernona Goods MD SOURCE: Patient, notes from [facility] health,  _________________________________   HISTORICAL  CHIEF COMPLAINT:  Chief Complaint  Patient presents with   Follow-up    Pt in 11 with son and interpreter Pt here for MS f/u Pt states no questions or concerns for todays visit     HISTORY OF PRESENT ILLNESS:  Ellsa Wiggins, is a 33 y.o. woman with MS.  Update 12/07/2023: The visit was conducted with an interpreter.     She feels her MS is stable and no new neurologic symptoms.  She is on Tecfidera  and she tolerates it well.   No GI symptoms, flushing or rash.  Her last MRI in May 2024 showed no new lesions.  Recently, she had to go to the ED due to low potassium and weakness.   She is going to see cardiology.  Symptoms improved while she was in the hospital  Gait is doing well and no stumbles or falls.  Balance is slightly off but she can go up and down stairs without using the banister .Aaron Aas  No numbness or tingling  Vision is fine.  Bladder function is fine.  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  This was occurring at work going back and forth between room and freezer.    She denies depression and anxieity.   Cognitive function is fine.  No longer having headaches    MS History: She presented to the [facility] ED in April 2022 with numbness below her neck and cramping in her hands bilaterally.       Initially, she also had some leg cramping.    She denied numbness,    She had an MRi of the brain showing some white matter foci, none that enhanced.   She had a telehealth consult with neurology.  Because the MRI showed some spots, she was admitted and received steroids.  She was admitted to the hospital and had 3 days of IV Solu-medrol.    She feels better since the steroids and now only ha cramping  in her hands,      She denies any similar symptoms or episodes of numbness, weakness, clumsiness, gait change or vision change n the past.   She has no FH of MS.     She is otherwise healthy and is not on any medications.     No h/o DM or HTN.   She does not smoke or drink ETOH.        Data: Imaging reviewed: CT scan of the head 11/17/2018 was normal.  MRI of the brain 11/16/2020 showed some scattered T2/FLAIR hyperintense foci in the hemispheres.  Mostly nonspecific.  A couple foci are periventricular.  None are juxtacortical.  No foci in the infratentorial white matter.  Normal enhancement pattern.  MRI cervical spine 12/17/2020 showed a T2 hyperintense focus within the central and posterior spinal cord adjacent to C3.  It does not enhance but slightly expands the cervical spinal cord.  The focus could be consistent with demyelination from transverse myelitis or multiple sclerosis  MRI brain 12/2022 showed Multiple T2/FLAIR hyperintense foci in the periventricular, subcortical and deep white matter of the cerebral hemispheres. None of the foci enhance or appear to be acute. The periventricular foci are consistent with chronic demyelinating plaque associated with multiple sclerosis. Other  foci are more nonspecific though could be seen with multiple sclerosis as well. Compared to the MRI from 11/16/2020, there are no new lesions.   Labs: Blood work 11/16/2020 showed normal B12, TSH, unremarkable CBC and CMP.  Negative pregnancy test.  CSF 12/30/2020 showed > 5 oligoclonal bands and IgG index was elevate (0.85)   REVIEW OF SYSTEMS: Constitutional: No fevers, chills, sweats, or change in appetite Eyes: No visual changes, double vision, eye pain Ear, nose and throat: No hearing loss, ear pain, nasal congestion, sore throat Cardiovascular: No chest pain, palpitations Respiratory:  No shortness of breath at rest or with exertion.   No wheezes GastrointestinaI: No nausea, vomiting, diarrhea, abdominal  pain, fecal incontinence Genitourinary:  No dysuria, urinary retention or frequency.  No nocturia. Musculoskeletal:  No neck pain, back pain Integumentary: No rash, pruritus, skin lesions Neurological: as above Psychiatric: No depression at this time.  No anxiety Endocrine: No palpitations, diaphoresis, change in appetite, change in weigh or increased thirst Hematologic/Lymphatic:  No anemia, purpura, petechiae. Allergic/Immunologic: No itchy/runny eyes, nasal congestion, recent allergic reactions, rashes  ALLERGIES: No Known Allergies  HOME MEDICATIONS:  Current Outpatient Medications:    Dimethyl Fumarate  240 MG CPDR, TAKE 1 CAPSULE BY MOUTH IN THE  MORNING AND AT BEDTIME, Disp: 60 capsule, Rfl: 0   VITAMIN D  PO, Take by mouth., Disp: , Rfl:    benzonatate (TESSALON) 100 MG capsule, Take 100 mg by mouth every 8 (eight) hours as needed for cough., Disp: , Rfl:    butalbital-acetaminophen -caffeine (FIORICET) 50-325-40 MG tablet, Take 1 tablet by mouth as needed for headache., Disp: , Rfl:    dicyclomine (BENTYL) 20 MG tablet, Take 20 mg by mouth 2 (two) times daily., Disp: , Rfl:    fluticasone (FLONASE) 50 MCG/ACT nasal spray, Place 2 sprays into both nostrils daily., Disp: , Rfl:    ibuprofen  (ADVIL ) 600 MG tablet, Take 1 tablet (600 mg total) by mouth every 6 (six) hours as needed for mild pain (pain score 1-3). (Patient not taking: Reported on 12/07/2023), Disp: 30 tablet, Rfl: 0   imipramine  (TOFRANIL ) 25 MG tablet, Take 25 mg by mouth at bedtime. (Patient not taking: Reported on 12/07/2023), Disp: , Rfl:    levocetirizine (XYZAL) 5 MG tablet, Take 5 mg by mouth at bedtime. (Patient not taking: Reported on 12/07/2023), Disp: , Rfl:    ondansetron  (ZOFRAN ) 4 MG tablet, Take 1 tablet (4 mg total) by mouth every 6 (six) hours. (Patient not taking: Reported on 12/07/2023), Disp: 12 tablet, Rfl: 0   rizatriptan  (MAXALT -MLT) 10 MG disintegrating tablet, Take 1 tablet (10 mg total) by mouth as needed  for migraine. May repeat in 2 hours if needed.   No more than 4/week (Patient not taking: Reported on 12/07/2023), Disp: 10 tablet, Rfl: 11   topiramate  (TOPAMAX ) 50 MG tablet, Take 1-2 tablets (50-100 mg total) by mouth at bedtime. 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  - will need colpo and likely biopsies in postpartum period     Migraine    Multiple sclerosis (HCC)  Numbness 05/06/2021   Vitamin D  deficiency 01/18/2021    PAST SURGICAL HISTORY: Past Surgical History:  Procedure Laterality Date   LEEP     TUBAL LIGATION      FAMILY HISTORY: Family History  Problem Relation Age of Onset   Diabetes Mother    Hypertension Mother    Hypertension Father    Hypertension Maternal Grandmother    Diabetes Maternal Grandmother    Multiple sclerosis Neg Hx     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  Tobacco Use   Smoking status: Never   Smokeless tobacco: Never  Vaping Use   Vaping status: Never Used  Substance and Sexual Activity   Alcohol use: Never   Drug use: Never   Sexual activity: Not on file  Other Topics Concern   Not on file  Social History Narrative   Right handed   Lives w/ husband and children   Caffeine use: none   Social Drivers of Corporate investment banker Strain: Not on file  Food Insecurity: Not on file  Transportation Needs: Not on  file  Physical Activity: Not on file  Stress: Not on file  Social Connections: Unknown (12/15/2021)   Received from [facility]   Social Network    Social Network: Not on file  Intimate Partner Violence: Not At Risk (10/15/2023)   Received from [facility]   HITS    Over the last 12 months how often did your partner physically hurt you?: Never    Over the last 12 months how often did your partner insult you or talk down to you?: Never    Over the last 12 months how often did your partner threaten you with physical harm?: Never    Over the last 12 months how often did your partner scream or curse at you?: Never     PHYSICAL EXAM  Vitals:   12/07/23 1134  BP: 120/68  Pulse: 73  Weight: 164 lb (74.4 kg)  Height: 5\' 5"  (1.651 m)     Body mass index is 27.29 kg/m.   General: The patient is well-developed and well-nourished and in no acute distress  HEENT:  Head is [place]/AT.  Sclera are anicteric.    Skin: Extremities are without rash or  edema.  Neurologic Exam  Mental status: The patient is alert and oriented x 3 at the time of the examination. The patient has apparent normal recent and remote memory, with an apparently normal attention span and concentration ability.   Speech is normal.  Cranial nerves: Extraocular movements are full.   Facial symmetry is present. There is good facial sensation to soft touch bilaterally.Facial strength is normal.   No obvious hearing deficits are noted.  Motor:  Muscle bulk is normal.   Tone is normal. Strength is  5 / 5 in all 4 extremities.   Sensory: She had normal sensation to touch and vibration in the arms and legs  Coordination: Cerebellar testing reveals good finger-nose-finger and heel-to-shin bilaterally.  Gait and station: Station is normal.   Gait is normal. Tandem gait is wide..  The Romberg is negative..  Reflexes: Deep tendon reflexes are symmetric and normal bilaterally.        ASSESSMENT AND  PLAN  Multiple sclerosis (HCC)  High risk medication use  Myalgia  Numbness   Continue Tecfidera .  Check CBC with differentia.  I will also check a vitamin D  level.  Later this year or early 2026, we will check an MRI to determine if there is any subclinical progression.  If this is occurring we would need to step up therapy to a highly effective DMT such as an anti-CD20 agent or natalizumab.   Continue Vit D supplements Stay active and exercise as tolerated Return in 6 months or sooner if there are new or worsening neurologic symptoms.  This visit is part of a comprehensive longitudinal care medical relationship regarding the patients primary diagnosis of multiple sclerosis and related concerns.   Oluwademilade Kellett A. Godwin Lat, MD, [facility] 12/07/2023, 11:46 AM Certified in Neurology, Clinical Neurophysiology, Sleep Medicine and Neuroimaging  [facility] [address], [address] [place], [place] [address] [phone]

## 2023-12-08 ENCOUNTER — Other Ambulatory Visit: Payer: Self-pay | Admitting: Neurology

## 2023-12-08 LAB — VITAMIN D 25 HYDROXY (VIT D DEFICIENCY, FRACTURES): Vit D, 25-Hydroxy: 31.8 ng/mL (ref 30.0–100.0)

## 2023-12-08 LAB — CBC WITH DIFFERENTIAL/PLATELET
Basophils Absolute: 0 10*3/uL (ref 0.0–0.2)
Basos: 0 %
EOS (ABSOLUTE): 0.2 10*3/uL (ref 0.0–0.4)
Eos: 2 %
Hematocrit: 40 % (ref 34.0–46.6)
Hemoglobin: 13.2 g/dL (ref 11.1–15.9)
Immature Grans (Abs): 0 10*3/uL (ref 0.0–0.1)
Immature Granulocytes: 0 %
Lymphocytes Absolute: 1 10*3/uL (ref 0.7–3.1)
Lymphs: 14 %
MCH: 29.7 pg (ref 26.6–33.0)
MCHC: 33 g/dL (ref 31.5–35.7)
MCV: 90 fL (ref 79–97)
Monocytes Absolute: 0.5 10*3/uL (ref 0.1–0.9)
Monocytes: 7 %
Neutrophils Absolute: 5.2 10*3/uL (ref 1.4–7.0)
Neutrophils: 77 %
Platelets: 254 10*3/uL (ref 150–450)
RBC: 4.44 x10E6/uL (ref 3.77–5.28)
RDW: 12.6 % (ref 11.7–15.4)
WBC: 6.9 10*3/uL (ref 3.4–10.8)

## 2023-12-11 NOTE — Telephone Encounter (Signed)
 Last seen on 12/07/23 Follow up scheduled on 07/17/24

## 2024-01-16 ENCOUNTER — Telehealth: Payer: Self-pay | Admitting: *Deleted

## 2024-01-16 NOTE — Telephone Encounter (Signed)
 Interpreter services used to leave detailed instructions for stress echo.

## 2024-01-17 ENCOUNTER — Ambulatory Visit: Attending: Cardiology

## 2024-01-17 ENCOUNTER — Ambulatory Visit: Payer: Self-pay | Admitting: Cardiology

## 2024-01-17 ENCOUNTER — Ambulatory Visit

## 2024-01-17 DIAGNOSIS — R079 Chest pain, unspecified: Secondary | ICD-10-CM | POA: Diagnosis present

## 2024-01-18 NOTE — Telephone Encounter (Signed)
 Results reviewed with pt as per Dr. Barclay Leyden note.  Pt verbalized understanding and had no additional questions. Routed to PCP. [facility] Interpreter [phone]

## 2024-07-17 ENCOUNTER — Encounter: Payer: Self-pay | Admitting: Family Medicine

## 2024-07-17 ENCOUNTER — Ambulatory Visit: Admitting: Family Medicine

## 2024-07-17 VITALS — BP 130/82 | HR 76 | Ht 61.0 in | Wt 174.2 lb

## 2024-07-17 DIAGNOSIS — E559 Vitamin D deficiency, unspecified: Secondary | ICD-10-CM

## 2024-07-17 DIAGNOSIS — M791 Myalgia, unspecified site: Secondary | ICD-10-CM

## 2024-07-17 DIAGNOSIS — Z79899 Other long term (current) drug therapy: Secondary | ICD-10-CM

## 2024-07-17 DIAGNOSIS — G43709 Chronic migraine without aura, not intractable, without status migrainosus: Secondary | ICD-10-CM | POA: Diagnosis not present

## 2024-07-17 DIAGNOSIS — G35D Multiple sclerosis, unspecified: Secondary | ICD-10-CM

## 2024-07-17 NOTE — Progress Notes (Signed)
 Chief Complaint  Patient presents with   Follow-up    Pt in room 2. Alone.Here for MS follow up. Jessica Wiggins interpreter on wheels in room.    HISTORY OF PRESENT ILLNESS:  07/17/24 ALL:  Jessica Wiggins is a 33 y.o. female here today for follow up for RRMS. She continues Tecfidera  and tolerates well. Last MRI brain stable 12/2022, cervical spine 12/2020 showed lesion adjacent to C3. LP confirmed > 5 oligoclonal bands. Labs 12/2023 stable.  She is doing fairly well from MS standpoint. No new or worsening symptoms. Gait is normal. No numbness or tingling.   Headaches have improved. She has stopped topiramate  and rizatriptan  as she did not feel meds were needed. She is taking Excedrin 2-3 times a week. She is status post bilateral tubal ligation.   She endorses LBP since birth of child two years ago. Waxes and wanes. Rest seems to make it hurt more. Better with activity. She is followed by Philippe Brunt, DNP in [place].   No vision changes. She is taking vitamin D  supplements.    HISTORY (copied from Dr Duncan previous note)  Jessica Wiggins, is a 33 y.o. woman with MS.   Update 12/07/2023: The visit was conducted with an interpreter.      She feels her MS is stable and no new neurologic symptoms.  She is on Tecfidera  and she tolerates it well.   No GI symptoms, flushing or rash.  Her last MRI in May 2024 showed no new lesions.   Recently, she had to go to the ED due to low potassium and weakness.   She is going to see cardiology.  Symptoms improved while she was in the hospital   Gait is doing well and no stumbles or falls.  Balance is slightly off but she can go up and down stairs without using the banister .SABRA  No numbness or tingling  Vision is fine.  Bladder function is fine.   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  This was occurring at work going back and forth between room and freezer.     She  denies depression and anxieity.   Cognitive function is fine.   No longer having headaches     MS History: She presented to the [facility] ED in April 2022 with numbness below her neck and cramping in her hands bilaterally.       Initially, she also had some leg cramping.    She denied numbness,    She had an MRi of the brain showing some white matter foci, none that enhanced.   She had a telehealth consult with neurology.  Because the MRI showed some spots, she was admitted and received steroids.  She was admitted to the hospital and had 3 days of IV Solu-medrol.    She feels better since the steroids and now only ha cramping in her hands,       She denies any similar symptoms or episodes of numbness, weakness, clumsiness, gait change or vision change n the past.   She has no FH of MS.      She is otherwise healthy and is not on any medications.     No h/o DM or HTN.   She does not smoke or drink ETOH.         Data: Imaging reviewed: CT scan of the head 11/17/2018 was  normal.   MRI of the brain 11/16/2020 showed some scattered T2/FLAIR hyperintense foci in the hemispheres.  Mostly nonspecific.  A couple foci are periventricular.  None are juxtacortical.  No foci in the infratentorial white matter.  Normal enhancement pattern.   MRI cervical spine 12/17/2020 showed a T2 hyperintense focus within the central and posterior spinal cord adjacent to C3.  It does not enhance but slightly expands the cervical spinal cord.  The focus could be consistent with demyelination from transverse myelitis or multiple sclerosis   MRI brain 12/2022 showed Multiple T2/FLAIR hyperintense foci in the periventricular, subcortical and deep white matter of the cerebral hemispheres. None of the foci enhance or appear to be acute. The periventricular foci are consistent with chronic demyelinating plaque associated with multiple sclerosis. Other foci are more nonspecific though could be seen with multiple sclerosis as well.  Compared to the MRI from 11/16/2020, there are no new lesions.    Labs: Blood work 11/16/2020 showed normal B12, TSH, unremarkable CBC and CMP.  Negative pregnancy test.   CSF 12/30/2020 showed > 5 oligoclonal bands and IgG index was elevate (0.85)   REVIEW OF SYSTEMS: Out of a complete 14 system review of symptoms, the patient complains only of the following symptoms, headaches, low back pain and all other reviewed systems are negative.   ALLERGIES: No Known Allergies   HOME MEDICATIONS: Outpatient Medications Prior to Visit  Medication Sig Dispense Refill   Dimethyl Fumarate  240 MG CPDR TOME 1 CAPSULA POR LA BOCA EN  LA MANANA AND AL ACOSTARSE 60 capsule 7   VITAMIN D  PO Take by mouth.     ibuprofen  (ADVIL ) 600 MG tablet Take 1 tablet (600 mg total) by mouth every 6 (six) hours as needed for mild pain (pain score 1-3). (Patient not taking: Reported on 12/07/2023) 30 tablet 0   imipramine  (TOFRANIL ) 25 MG tablet Take 25 mg by mouth at bedtime. (Patient not taking: Reported on 12/07/2023)     levocetirizine (XYZAL) 5 MG tablet Take 5 mg by mouth at bedtime. (Patient not taking: Reported on 12/07/2023)     ondansetron  (ZOFRAN ) 4 MG tablet Take 1 tablet (4 mg total) by mouth every 6 (six) hours. (Patient not taking: Reported on 12/07/2023) 12 tablet 0   rizatriptan  (MAXALT -MLT) 10 MG disintegrating tablet Take 1 tablet (10 mg total) by mouth as needed for migraine. May repeat in 2 hours if needed.   No more than 4/week (Patient not taking: Reported on 12/07/2023) 10 tablet 11   topiramate  (TOPAMAX ) 50 MG tablet Take 1-2 tablets (50-100 mg total) by mouth at bedtime. (Patient not taking: Reported on 12/07/2023) 60 tablet 5   No facility-administered medications prior to visit.     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  - will need colpo and likely biopsies in postpartum period     Migraine    Multiple sclerosis    Numbness 05/06/2021   Vitamin D  deficiency 01/18/2021     PAST SURGICAL HISTORY: Past Surgical History:  Procedure Laterality Date   LEEP     TUBAL LIGATION       FAMILY HISTORY: Family  History  Problem Relation Age of Onset   Diabetes Mother    Hypertension Mother    Hypertension Father    Hypertension Maternal Grandmother    Diabetes Maternal Grandmother    Multiple sclerosis Neg Hx      SOCIAL HISTORY: 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  Tobacco Use   Smoking status: Never   Smokeless tobacco: Never  Vaping Use   Vaping status: Never Used  Substance and Sexual Activity   Alcohol use: Never   Drug use: Never   Sexual activity: Not on file  Other Topics Concern   Not on file  Social History Narrative   Right handed   Lives w/ husband and children   Caffeine use: none   Social Drivers of Corporate Investment Banker Strain: Not on file  Food Insecurity: Not on file  Transportation Needs: Not on file  Physical Activity: Not on file  Stress: Not on file  Social Connections: Not on file  Intimate Partner Violence: Not At Risk (10/15/2023)   Received from [facility]   HITS    Over the last 12 months how often did your partner scream or curse at you?: Never    Over the last 12 months how often did your partner threaten you with physical harm?: Never    Over the last 12 months how often did your partner physically hurt you?: Never    Over the last 12 months how often did your partner insult you or talk  down to you?: Never     PHYSICAL EXAM  Vitals:   07/17/24 1321  BP: 130/82  Pulse: 76  SpO2: 96%  Weight: 174 lb 3.2 oz (79 kg)  Height: 5' 1 (1.549 m)    Body mass index is 32.91 kg/m.  Generalized: Well developed, in no acute distress  Cardiology: normal rate and rhythm, no murmur auscultated  Respiratory: clear to auscultation bilaterally    Neurological examination  Mentation: Alert oriented to time, place, history taking. Follows all commands speech and language fluent Cranial nerve II-XII: Pupils were equal round reactive to light. Extraocular movements were full, visual field were full on confrontational test. Facial sensation and strength were normal. Head turning and shoulder shrug  were normal and symmetric. Motor: The motor testing reveals 5 over 5 strength of all 4 extremities. Good symmetric motor tone is noted throughout.  Sensory: Sensory testing is intact to soft touch on all 4 extremities. No evidence of extinction is noted.  Coordination: Cerebellar testing reveals good finger-nose-finger and heel-to-shin bilaterally.  Gait and station: Gait is normal.  Reflexes: Deep tendon reflexes are symmetric and normal bilaterally.    DIAGNOSTIC DATA (LABS, IMAGING, TESTING) - I reviewed patient records, labs, notes, testing and imaging myself where available.  Lab Results  Component Value Date   WBC 6.9 12/07/2023   HGB 13.2 12/07/2023   HCT 40.0 12/07/2023   MCV 90 12/07/2023   PLT 254 12/07/2023      Component Value Date/Time   NA 140 11/21/2023 0925   K 4.3 11/21/2023 0925   CL 105 11/21/2023 0925   CO2 21 11/21/2023 0925   GLUCOSE 87 11/21/2023 0925   GLUCOSE 94 09/15/2022 0820   BUN 14 11/21/2023 0925   CREATININE  0.71 11/21/2023 0925   CALCIUM 9.1 11/21/2023 0925   PROT 6.8 05/30/2023 1120   ALBUMIN 4.5 05/30/2023 1120   ALBUMIN 3.9 12/30/2020 1057   AST 15 05/30/2023 1120   ALT 12 05/30/2023 1120   ALKPHOS 55 05/30/2023 1120   BILITOT 0.3  05/30/2023 1120   GFRNONAA >60 09/15/2022 0820   No results found for: CHOL, HDL, LDLCALC, LDLDIRECT, TRIG, CHOLHDL No results found for: YHAJ8R No results found for: VITAMINB12 No results found for: TSH      No data to display               No data to display           ASSESSMENT AND PLAN  33 y.o. year old female  has a past medical history of Chronic migraine w/o aura w/o status migrainosus, not intractable (09/09/2021), Heat intolerance (01/21/2022), High risk medication use (01/18/2021), Hyperhidrosis (01/21/2022), LGSIL on Pap smear of cervix (12/20/2018), Migraine, Multiple sclerosis, Numbness (05/06/2021), and Vitamin D  deficiency (01/18/2021). here with    Multiple sclerosis - Plan: CBC with Differential/Platelets, CMP  High risk medication use  Myalgia  Chronic migraine w/o aura w/o status migrainosus, not intractable  Vitamin D  deficiency - Plan: Vitamin D , 25-hydroxy  Nethra Tressa Maldonado is doing well from MS standpoint. I will update labs. She will continue Tecfidera . She will continue Excedrin as needed for migraine abortion. Advised not to take abortive meds more than 1-2 times a week. She will follow up with PCP regarding back pain since 2020. Neuro exam intact. Healthy lifestyle habits encouraged. She will follow up with PCP as directed. She will return to see Dr Vear in 6 months, sooner if needed. She verbalizes understanding and agreement with this plan.    Orders Placed This Encounter  Procedures   CBC with Differential/Platelets   CMP   Vitamin D , 25-hydroxy     No orders of the defined types were placed in this encounter.   I spent 30 minutes of face-to-face and non-face-to-face time with patient.  This included previsit chart review, lab review, study review, order entry, electronic health record documentation, patient education.   Greig Forbes, MSN, FNP-C 07/17/2024, 1:52 PM  [facility] [address], [address] [place], [place] [address] [phone]

## 2024-07-17 NOTE — Patient Instructions (Signed)
 Below is our plan:  We will continue dimethyl fumerate. Continue Excedrin as needed for headache abortion.   Please make sure you are staying well hydrated. I recommend 50-60 ounces daily. Well balanced diet and regular exercise encouraged. Consistent sleep schedule with 6-8 hours recommended.   Please continue follow up with care team as directed.   Follow up with Dr Vear in 6 months   You may receive a survey regarding today's visit. I encourage you to leave honest feed back as I do use this information to improve patient care. Thank you for seeing me today!

## 2024-07-18 ENCOUNTER — Ambulatory Visit: Payer: Self-pay | Admitting: Family Medicine

## 2024-07-18 LAB — COMPREHENSIVE METABOLIC PANEL WITH GFR
ALT: 11 IU/L (ref 0–32)
AST: 14 IU/L (ref 0–40)
Albumin: 4.4 g/dL (ref 3.9–4.9)
Alkaline Phosphatase: 69 IU/L (ref 41–116)
BUN/Creatinine Ratio: 15 (ref 9–23)
BUN: 12 mg/dL (ref 6–20)
Bilirubin Total: 0.6 mg/dL (ref 0.0–1.2)
CO2: 23 mmol/L (ref 20–29)
Calcium: 9.2 mg/dL (ref 8.7–10.2)
Chloride: 101 mmol/L (ref 96–106)
Creatinine, Ser: 0.82 mg/dL (ref 0.57–1.00)
Globulin, Total: 2.2 g/dL (ref 1.5–4.5)
Glucose: 107 mg/dL — ABNORMAL HIGH (ref 70–99)
Potassium: 3.8 mmol/L (ref 3.5–5.2)
Sodium: 138 mmol/L (ref 134–144)
Total Protein: 6.6 g/dL (ref 6.0–8.5)
eGFR: 97 mL/min/1.73 (ref >59)

## 2024-07-18 LAB — CBC WITH DIFFERENTIAL/PLATELET
Basophils Absolute: 0 x10E3/uL (ref 0.0–0.2)
Basos: 0 %
EOS (ABSOLUTE): 0.1 x10E3/uL (ref 0.0–0.4)
Eos: 2 %
Hematocrit: 40.5 % (ref 34.0–46.6)
Hemoglobin: 13.4 g/dL (ref 11.1–15.9)
Immature Grans (Abs): 0 x10E3/uL (ref 0.0–0.1)
Immature Granulocytes: 0 %
Lymphocytes Absolute: 1.2 x10E3/uL (ref 0.7–3.1)
Lymphs: 18 %
MCH: 29.8 pg (ref 26.6–33.0)
MCHC: 33.1 g/dL (ref 31.5–35.7)
MCV: 90 fL (ref 79–97)
Monocytes Absolute: 0.4 x10E3/uL (ref 0.1–0.9)
Monocytes: 6 %
Neutrophils Absolute: 4.9 x10E3/uL (ref 1.4–7.0)
Neutrophils: 74 %
Platelets: 258 x10E3/uL (ref 150–450)
RBC: 4.5 x10E6/uL (ref 3.77–5.28)
RDW: 12.5 % (ref 11.7–15.4)
WBC: 6.7 x10E3/uL (ref 3.4–10.8)

## 2024-07-18 LAB — VITAMIN D 25 HYDROXY (VIT D DEFICIENCY, FRACTURES): Vit D, 25-Hydroxy: 25.9 ng/mL — ABNORMAL LOW (ref 30.0–100.0)

## 2024-08-24 ENCOUNTER — Other Ambulatory Visit: Payer: Self-pay | Admitting: Neurology

## 2024-08-27 ENCOUNTER — Other Ambulatory Visit: Payer: Self-pay

## 2024-08-27 ENCOUNTER — Encounter (HOSPITAL_BASED_OUTPATIENT_CLINIC_OR_DEPARTMENT_OTHER): Payer: Self-pay | Admitting: *Deleted

## 2024-08-27 ENCOUNTER — Emergency Department (HOSPITAL_BASED_OUTPATIENT_CLINIC_OR_DEPARTMENT_OTHER)
Admission: EM | Admit: 2024-08-27 | Discharge: 2024-08-27 | Disposition: A | Discharge status: Home / Self Care | Attending: Emergency Medicine | Admitting: Emergency Medicine

## 2024-08-27 DIAGNOSIS — R0981 Nasal congestion: Secondary | ICD-10-CM | POA: Diagnosis not present

## 2024-08-27 DIAGNOSIS — R059 Cough, unspecified: Secondary | ICD-10-CM | POA: Diagnosis present

## 2024-08-27 DIAGNOSIS — R6889 Other general symptoms and signs: Secondary | ICD-10-CM

## 2024-08-27 DIAGNOSIS — R509 Fever, unspecified: Secondary | ICD-10-CM | POA: Diagnosis not present

## 2024-08-27 DIAGNOSIS — J3489 Other specified disorders of nose and nasal sinuses: Secondary | ICD-10-CM | POA: Insufficient documentation

## 2024-08-27 DIAGNOSIS — J029 Acute pharyngitis, unspecified: Secondary | ICD-10-CM | POA: Diagnosis not present

## 2024-08-27 LAB — GROUP A STREP BY PCR: Group A Strep by PCR: NOT DETECTED

## 2024-08-27 LAB — RESP PANEL BY RT-PCR (RSV, FLU A&B, COVID)  RVPGX2
Influenza A by PCR: NEGATIVE
Influenza B by PCR: NEGATIVE
Resp Syncytial Virus by PCR: NEGATIVE
SARS Coronavirus 2 by RT PCR: NEGATIVE

## 2024-08-27 MED ORDER — ACETAMINOPHEN 325 MG PO TABS
ORAL_TABLET | ORAL | Status: AC
  Filled 2024-08-27: qty 2

## 2024-08-27 MED ORDER — ACETAMINOPHEN 325 MG PO TABS
650.0000 mg | ORAL_TABLET | Freq: Once | ORAL | Status: AC
  Administered 2024-08-27: 650 mg via ORAL

## 2024-08-27 MED ORDER — IBUPROFEN 600 MG PO TABS: 600.0000 mg | ORAL_TABLET | Freq: Four times a day (QID) | ORAL | 0 refills | Status: AC | PRN

## 2024-08-27 MED ORDER — BENZONATATE 100 MG PO CAPS: 100.0000 mg | ORAL_CAPSULE | Freq: Three times a day (TID) | ORAL | 0 refills | Status: AC

## 2024-08-27 MED ORDER — ACETAMINOPHEN 500 MG PO TABS: 1000.0000 mg | ORAL_TABLET | Freq: Three times a day (TID) | ORAL | 0 refills | Status: AC | PRN

## 2024-08-27 NOTE — Discharge Instructions (Signed)
 Take 1000 mg of Tylenol  every 8 hours.  Take 600 mg of ibuprofen  every 8 hours with food.  Take Tessalon  Perles as needed for cough.  Make sure you are staying well-hydrated.  For sore throat try warm tea with honey and lemon.  Try cough drops.  Return to emergency room with new or worsening symptoms.  Tome 1000 mg de Tylenol  cada 8 horas. Tome 600 mg de ibuprofeno cada 8 horas con las comidas. Tome Tessalon  Perles segn sea necesario para la tos. Asegrese de mantenerse bien hidratado. Para el dolor de garganta, pruebe con t caliente con miel y limn. Pruebe tambin con pastillas para la tos. Regrese a la sala de emergencias si presenta sntomas nuevos o si sus sntomas empeoran.

## 2024-08-27 NOTE — ED Provider Notes (Signed)
 " [place] EMERGENCY DEPARTMENT AT [facility] Provider Note   CSN: [id number] Arrival date & time: 08/27/24  9071     Patient presents with: Cough   Jessica Wiggins is a 34 y.o. female.  Patient presents to emergency room with 3 days of fever cough sore throat congestion runny nose.  She reports that she has fever of 100 F at home.  She denies any chest pain or shortness of breath.  No ear pain.  She reports that her daughter was recently diagnosed with influenza A. Denies weakness/numbness/headache.   History was obtained by Spanish interpreter.    Cough      Prior to Admission medications  Medication Sig Start Date End Date Taking? Authorizing Provider  acetaminophen  (TYLENOL ) 500 MG tablet Take 2 tablets (1,000 mg total) by mouth every 8 (eight) hours as needed for fever, headache, mild pain (pain score 1-3) or moderate pain (pain score 4-6). 08/27/24  Yes Aaliayah Miao, Warren SAILOR, PA-C  benzonatate  (TESSALON ) 100 MG capsule Take 1 capsule (100 mg total) by mouth every 8 (eight) hours. 08/27/24  Yes Shauntavia Brackin N, PA-C  Dimethyl Fumarate  240 MG CPDR TOME 1 CAPSULA POR LA BOCA EN  LA MANANA AND AL ACOSTARSE 08/26/24   Lomax, Amy, NP  ibuprofen  (ADVIL ) 600 MG tablet Take 1 tablet (600 mg total) by mouth every 6 (six) hours as needed for mild pain (pain score 1-3). 08/27/24   Valarie Farace, Warren SAILOR, PA-C  VITAMIN D  PO Take by mouth.    [provider]    Allergies: Patient has no known allergies.    Review of Systems  Respiratory:  Positive for cough.     Updated Vital Signs BP 133/85 (BP Location: Right Wrist)   Pulse (!) 103   Temp (!) 101.9 F (38.8 C) (Oral)   Resp 20   Wt 78.9 kg   LMP 07/31/2024 (Exact Date)   SpO2 97%   BMI 32.88 kg/m   Physical Exam Vitals and nursing note reviewed.  Constitutional:      General: She is not in acute distress.    Appearance: She is not toxic-appearing.  HENT:     Head: Normocephalic and atraumatic.     Ears:      Comments: No acute otitis media or otitis externa.    Nose: Congestion and rhinorrhea present.     Mouth/Throat:     Pharynx: Posterior oropharyngeal erythema present. No oropharyngeal exudate.  Eyes:     General: No scleral icterus.    Conjunctiva/sclera: Conjunctivae normal.  Cardiovascular:     Rate and Rhythm: Regular rhythm. Tachycardia present.     Pulses: Normal pulses.     Heart sounds: Normal heart sounds.  Pulmonary:     Effort: Pulmonary effort is normal. No respiratory distress.     Breath sounds: Normal breath sounds.     Comments: Lungs are clear to auscultation bilaterally no hypoxia. Abdominal:     General: Abdomen is flat. Bowel sounds are normal.     Palpations: Abdomen is soft.     Tenderness: There is no abdominal tenderness.  Skin:    General: Skin is warm and dry.     Findings: No lesion.  Neurological:     General: No focal deficit present.     Mental Status: She is alert and oriented to person, place, and time. Mental status is at baseline.     (all labs ordered are listed, but only abnormal results are displayed) Labs Reviewed  RESP PANEL BY RT-PCR (RSV, FLU A&B, COVID)  [id number]  GROUP A STREP BY PCR    EKG: None  Radiology: No results found.   Procedures   Medications Ordered in the ED  acetaminophen  (TYLENOL ) tablet 650 mg (650 mg Oral Given 08/27/24 1002)                                    Medical Decision Making Risk OTC drugs. Prescription drug management.   This patient presents to the ED for concern of flu like symptoms, this involves an extensive number of treatment options, and is a complaint that carries with it a high risk of complications and morbidity.  The differential diagnosis includes pneumonia, viral URI with cough, otitis media, otitis externa, strep throat, mono, other viral pharyngitis, peritonsillar abscess   Lab Tests:  I personally interpreted labs.  The pertinent results include:   Resp panel  negative Strep test negative    Problem List / ED Course / Critical interventions / Medication management  Patient presents to the emergency room with flulike symptoms.  On arrival she is found to have low-grade fever and mildly tachycardic.  She is in no acute distress not hypoxic.  Her lungs are clear to auscultation.  She is well-appearing.  She does have sick contact and is now presenting with similar symptoms.  Denies any chest pain or shortness of breath with this.  She was tested for viral illness, strep throat which came back negative.  Her symptoms are most consistent with viral illness and I would recommend treating symptomatically at this time. I ordered medication including Tylenol  for fever Reevaluation of the patient after these medicines showed that the patient improved I have reviewed the patients home medicines and have made adjustments as needed Patient hemodynamically stable and well-appearing throughout stay.  Feel appropriate for discharge with outpatient follow-up.  Discussed symptom management and return precautions.         Final diagnoses:  Flu-like symptoms    ED Discharge Orders          Ordered    benzonatate  (TESSALON ) 100 MG capsule  Every 8 hours        08/27/24 1218    ibuprofen  (ADVIL ) 600 MG tablet  Every 6 hours PRN        08/27/24 1218    acetaminophen  (TYLENOL ) 500 MG tablet  Every 8 hours PRN        08/27/24 1218               Takeo Harts, Warren SAILOR, PA-C 08/27/24 1245  "

## 2024-08-27 NOTE — ED Triage Notes (Signed)
 Here by POV from home for ILI,  including: fever, cough, CP with cough, chills and sore throat. No frelief with tylenol  or ibuprofen  last taqken yesterday. Highest fever 100. Sx onset Sunday. Rates pain 10/10. Alert, NAD, calm, interactive, resps e/u, speaking in slear sentences. Triage assisted by [facility] Spanish interpreter [place] [phone]. Sick contact with daughter. H/o MS. LMP 12/24.

## 2025-03-04 ENCOUNTER — Ambulatory Visit: Admitting: Neurology
